# Patient Record
Sex: Female | Born: 2000 | Race: Black or African American | Hispanic: No | Marital: Single | Smoking: Former smoker
Health system: Southern US, Community
[De-identification: ages and names within clinical notes are randomized; demographics above are authoritative.]

## PROBLEM LIST (undated history)

## (undated) DIAGNOSIS — Z789 Other specified health status: Secondary | ICD-10-CM

## (undated) DIAGNOSIS — D219 Benign neoplasm of connective and other soft tissue, unspecified: Secondary | ICD-10-CM

## (undated) HISTORY — DX: Benign neoplasm of connective and other soft tissue, unspecified: D21.9

## (undated) HISTORY — DX: Other specified health status: Z78.9

---
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
## 2019-04-06 ENCOUNTER — Emergency Department
Admission: EM | Admit: 2019-04-06 | Discharge: 2019-04-06 | Disposition: A | Discharge status: Home / Self Care | Payer: Medicaid Other | Attending: Emergency Medicine | Admitting: Emergency Medicine

## 2019-04-06 ENCOUNTER — Other Ambulatory Visit: Payer: Self-pay

## 2019-04-06 ENCOUNTER — Encounter: Payer: Self-pay | Admitting: Emergency Medicine

## 2019-04-06 DIAGNOSIS — N390 Urinary tract infection, site not specified: Secondary | ICD-10-CM

## 2019-04-06 DIAGNOSIS — R1030 Lower abdominal pain, unspecified: Secondary | ICD-10-CM | POA: Diagnosis present

## 2019-04-06 DIAGNOSIS — N3090 Cystitis, unspecified without hematuria: Secondary | ICD-10-CM | POA: Insufficient documentation

## 2019-04-06 LAB — URINALYSIS, COMPLETE (UACMP) WITH MICROSCOPIC
Bilirubin Urine: NEGATIVE
Glucose, UA: NEGATIVE mg/dL
Hgb urine dipstick: NEGATIVE
Ketones, ur: NEGATIVE mg/dL
Nitrite: NEGATIVE
Protein, ur: NEGATIVE mg/dL
Specific Gravity, Urine: 1.026 (ref 1.005–1.030)
pH: 5 (ref 5.0–8.0)

## 2019-04-06 LAB — COMPREHENSIVE METABOLIC PANEL
ALT: 12 U/L (ref 0–44)
AST: 18 U/L (ref 15–41)
Albumin: 3.8 g/dL (ref 3.5–5.0)
Alkaline Phosphatase: 45 U/L (ref 38–126)
Anion gap: 6 (ref 5–15)
BUN: 10 mg/dL (ref 6–20)
CO2: 29 mmol/L (ref 22–32)
Calcium: 9.2 mg/dL (ref 8.9–10.3)
Chloride: 104 mmol/L (ref 98–111)
Creatinine, Ser: 0.84 mg/dL (ref 0.44–1.00)
GFR calc Af Amer: >60 mL/min (ref >60)
GFR calc non Af Amer: >60 mL/min (ref >60)
Glucose, Bld: 69 mg/dL — ABNORMAL LOW (ref 70–99)
Potassium: 3.5 mmol/L (ref 3.5–5.1)
Sodium: 139 mmol/L (ref 135–145)
Total Bilirubin: 0.3 mg/dL (ref 0.3–1.2)
Total Protein: 7.1 g/dL (ref 6.5–8.1)

## 2019-04-06 LAB — LIPASE, BLOOD: Lipase: 20 U/L (ref 11–51)

## 2019-04-06 LAB — CHLAMYDIA/NGC RT PCR (ARMC ONLY)
Chlamydia Tr: DETECTED — AB
N gonorrhoeae: DETECTED — AB

## 2019-04-06 LAB — CBC
HCT: 36.4 % (ref 36.0–46.0)
Hemoglobin: 11.6 g/dL — ABNORMAL LOW (ref 12.0–15.0)
MCH: 27.8 pg (ref 26.0–34.0)
MCHC: 31.9 g/dL (ref 30.0–36.0)
MCV: 87.3 fL (ref 80.0–100.0)
Platelets: 242 10*3/uL (ref 150–400)
RBC: 4.17 MIL/uL (ref 3.87–5.11)
RDW: 15.9 % — ABNORMAL HIGH (ref 11.5–15.5)
WBC: 8.1 10*3/uL (ref 4.0–10.5)
nRBC: 0 % (ref 0.0–0.2)

## 2019-04-06 LAB — CHLAMYDIA/NGC RT PCR (ARMC ONLY)??????????

## 2019-04-06 LAB — POCT PREGNANCY, URINE: Preg Test, Ur: NEGATIVE

## 2019-04-06 MED ORDER — SULFAMETHOXAZOLE-TRIMETHOPRIM 800-160 MG PO TABS: 1.0000 | ORAL_TABLET | Freq: Two times a day (BID) | ORAL | 0 refills | Status: DC

## 2019-04-06 MED ORDER — PHENAZOPYRIDINE HCL 200 MG PO TABS: 200.0000 mg | ORAL_TABLET | Freq: Three times a day (TID) | ORAL | 0 refills | Status: DC | PRN

## 2019-04-06 MED ORDER — SODIUM CHLORIDE 0.9% FLUSH: 3.0000 mL | Freq: Once | INTRAVENOUS | Status: DC

## 2019-04-06 NOTE — ED Provider Notes (Signed)
[facility] Emergency Department Provider Note       Time seen: ----------------------------------------- 4:27 PM on 04/06/2019 -----------------------------------------   I have reviewed the triage vital signs and the nursing notes.  HISTORY   Chief Complaint Abdominal Pain    HPI Pamela Brock is a 18 y.o. female with no significant past medical history who presents to the ED for lower abdominal pain for the past 5 to 6 days.  She has also had pain in her upper abdomen as well.  Last bowel movement was yesterday, she has not had any vomiting, just nausea.  She was concerned she may be pregnant.  She has had unprotected sex.  Denies vaginal bleeding or discharge.  No concerns for sexually transmitted infection  History reviewed. No pertinent past medical history.  There are no active problems to display for this patient.   History reviewed. No pertinent surgical history.  Allergies Patient has no known allergies.  Social History Social History   Tobacco Use  . Smoking status: Never Smoker  . Smokeless tobacco: Never Used  Substance Use Topics  . Alcohol use: No  . Drug use: No   Review of Systems Constitutional: Negative for fever. Cardiovascular: Negative for chest pain. Respiratory: Negative for shortness of breath. Gastrointestinal: Positive for abdominal pain, nausea Musculoskeletal: Negative for back pain. Skin: Negative for rash. Neurological: Negative for headaches, focal weakness or numbness.  All systems negative/normal/unremarkable except as stated in the HPI  ____________________________________________   PHYSICAL EXAM:  VITAL SIGNS: ED Triage Vitals  Enc Vitals Group     BP 04/06/19 1459 109/67     Pulse Rate 04/06/19 1459 69     Resp 04/06/19 1459 16     Temp 04/06/19 1459 97.9 F (36.6 C)     Temp Source 04/06/19 1459 Oral     SpO2 04/06/19 1459 100 %     Weight 04/06/19 1501 240 lb 1.3 oz (108.9 kg)      Height --      Head Circumference --      Peak Flow --      Pain Score 04/06/19 1501 6     Pain Loc --      Pain Edu? --      Excl. in [place]? --    Constitutional: Alert and oriented. Well appearing and in no distress. Eyes: Conjunctivae are normal. Normal extraocular movements. Cardiovascular: Normal rate, regular rhythm. No murmurs, rubs, or gallops. Respiratory: Normal respiratory effort without tachypnea nor retractions. Breath sounds are clear and equal bilaterally. No wheezes/rales/rhonchi. Gastrointestinal: Soft and nontender. Normal bowel sounds Musculoskeletal: Nontender with normal range of motion in extremities. No lower extremity tenderness nor edema. Neurologic:  Normal speech and language. No gross focal neurologic deficits are appreciated.  Skin:  Skin is warm, dry and intact. No rash noted. Psychiatric: Mood and affect are normal. Speech and behavior are normal.  ____________________________________________  ED COURSE:  As part of my medical decision making, I reviewed the following data within the [place] History obtained from family if available, nursing notes, old chart and ekg, as well as notes from prior ED visits. Patient presented for abdominal pain, we will assess with labs as indicated at this time.   Procedures  Pamela Brock was evaluated in Emergency Department on 04/06/2019 for the symptoms described in the history of present illness. She was evaluated in the context of the global COVID-19 pandemic, which necessitated consideration that the patient might be at  risk for infection with the SARS-CoV-2 virus that causes COVID-19. Institutional protocols and algorithms that pertain to the evaluation of patients at risk for COVID-19 are in a state of rapid change based on information released by regulatory bodies including the CDC and federal and state organizations. These policies and algorithms were followed during the patient's care in the ED.   ____________________________________________   LABS (pertinent positives/negatives)  Labs Reviewed  COMPREHENSIVE METABOLIC PANEL - Abnormal; Notable for the following components:      Result Value   Glucose, Bld 69 (*)    All other components within normal limits  CBC - Abnormal; Notable for the following components:   Hemoglobin 11.6 (*)    RDW 15.9 (*)    All other components within normal limits  URINALYSIS, COMPLETE (UACMP) WITH MICROSCOPIC - Abnormal; Notable for the following components:   Color, Urine YELLOW (*)    APPearance CLOUDY (*)    Leukocytes,Ua LARGE (*)    Bacteria, UA RARE (*)    All other components within normal limits  CHLAMYDIA/NGC RT PCR ([facility] ONLY)  LIPASE, BLOOD  POC URINE PREG, ED  POCT PREGNANCY, URINE  ____________________________________________   DIFFERENTIAL DIAGNOSIS   Pregnancy, UTI, PID, ovarian cyst, constipation, gas pain  FINAL ASSESSMENT AND PLAN  Cystitis   Plan: The patient had presented for lower abdominal pain. Patient's labs revealed likely UTI without other acute process.  GC and Chlamydia testing are still pending.  She will be started on Pyridium and Bactrim DS for UTI.  She is cleared for outpatient follow-up.  Laurence Aly, MD    Note: This note was generated in part or whole with voice recognition software. Voice recognition is usually quite accurate but there are transcription errors that can and very often do occur. I apologize for any typographical errors that were not detected and corrected.     Earleen Newport, MD 04/06/19 [phone]

## 2019-04-06 NOTE — ED Triage Notes (Signed)
C/O generalized abdominal pain x 5-6 days.  Describes lower abdominal pain primarily, but also c/o generalized abdominal pain.  Last BM 04/05/19.  No emesis, just nausea.  AAOx3.  Skin warm and dry. NAD

## 2019-04-06 NOTE — ED Notes (Signed)
Pt ambulatory from triage to the room at this time. NAD noted.

## 2019-04-07 ENCOUNTER — Other Ambulatory Visit: Payer: Self-pay

## 2019-04-07 ENCOUNTER — Ambulatory Visit: Payer: Medicaid Other | Admitting: Family Medicine

## 2019-04-07 ENCOUNTER — Encounter: Payer: Self-pay | Admitting: Family Medicine

## 2019-04-07 ENCOUNTER — Telehealth: Payer: Self-pay | Admitting: Emergency Medicine

## 2019-04-07 DIAGNOSIS — B9689 Other specified bacterial agents as the cause of diseases classified elsewhere: Secondary | ICD-10-CM

## 2019-04-07 DIAGNOSIS — A549 Gonococcal infection, unspecified: Secondary | ICD-10-CM

## 2019-04-07 DIAGNOSIS — A749 Chlamydial infection, unspecified: Secondary | ICD-10-CM

## 2019-04-07 DIAGNOSIS — F1210 Cannabis abuse, uncomplicated: Secondary | ICD-10-CM | POA: Insufficient documentation

## 2019-04-07 DIAGNOSIS — A64 Unspecified sexually transmitted disease: Secondary | ICD-10-CM

## 2019-04-07 DIAGNOSIS — Z113 Encounter for screening for infections with a predominantly sexual mode of transmission: Secondary | ICD-10-CM

## 2019-04-07 DIAGNOSIS — N730 Acute parametritis and pelvic cellulitis: Secondary | ICD-10-CM

## 2019-04-07 DIAGNOSIS — N760 Acute vaginitis: Secondary | ICD-10-CM

## 2019-04-07 LAB — WET PREP FOR TRICH, YEAST, CLUE
Trichomonas Exam: NEGATIVE
Yeast Exam: NEGATIVE

## 2019-04-07 MED ORDER — DOXYCYCLINE HYCLATE 100 MG PO CAPS: 100.0000 mg | ORAL_CAPSULE | Freq: Two times a day (BID) | ORAL | 0 refills | Status: DC

## 2019-04-07 MED ORDER — METRONIDAZOLE 500 MG PO TABS: 500.0000 mg | ORAL_TABLET | Freq: Two times a day (BID) | ORAL | 0 refills | Status: AC

## 2019-04-07 MED ORDER — CEFTRIAXONE SODIUM 250 MG IJ SOLR
250.0000 mg | Freq: Once | INTRAMUSCULAR | Status: AC
  Administered 2019-04-07: 250 mg via INTRAMUSCULAR

## 2019-04-07 MED ORDER — DOXYCYCLINE HYCLATE 100 MG PO CAPS: 100.0000 mg | ORAL_CAPSULE | Freq: Two times a day (BID) | ORAL | 0 refills | Status: AC

## 2019-04-07 NOTE — Progress Notes (Signed)
STI clinic/screening visit  Subjective:  Pamela Brock is a 18 y.o. female being seen today for an STI screening visit. The patient reports they do have symptoms.  Patient has the following medical conditions:   Patient Active Problem List   Diagnosis Date Noted  . Marijuana abuse 04/07/2019   Chief Complaint  Patient presents with  . SEXUALLY TRANSMITTED DISEASE    HPI  Patient reports Abdominal pain, cramping, nausea for 1 week.   See flowsheet for further details and programmatic requirements.    The following portions of the patient's history were reviewed and updated as appropriate: allergies, current medications, past medical history, past social history, past surgical history and problem list.  Objective:  There were no vitals filed for this visit.  Physical Exam Vitals signs and nursing note reviewed.  Constitutional:      Appearance: Normal appearance.  HENT:     Head: Normocephalic and atraumatic.     Mouth/Throat:     Mouth: Mucous membranes are moist.     Pharynx: Oropharynx is clear. No oropharyngeal exudate or posterior oropharyngeal erythema.  Pulmonary:     Effort: Pulmonary effort is normal.  Abdominal:     General: Abdomen is flat.     Palpations: There is no mass.     Tenderness: There is no abdominal tenderness. There is no rebound.  Genitourinary:    General: Normal vulva.     Exam position: Lithotomy position.     Pubic Area: No rash or pubic lice.      Labia:        Right: No rash or lesion.        Left: No rash or lesion.      Vagina: Normal. No vaginal discharge, erythema, bleeding or lesions.     Cervix: Cervical motion tenderness and discharge (creamy, copious from os) present. No friability, lesion or erythema.     Uterus: Normal. Tender.      Adnexa:        Right: Tenderness present.        Left: Tenderness present.      Rectum: Normal.  Lymphadenopathy:     Head:     Right side of head: No preauricular or posterior auricular  adenopathy.     Left side of head: No preauricular or posterior auricular adenopathy.     Cervical: No cervical adenopathy.     Upper Body:     Right upper body: No supraclavicular or axillary adenopathy.     Left upper body: No supraclavicular or axillary adenopathy.     Lower Body: No right inguinal adenopathy. No left inguinal adenopathy.  Skin:    General: Skin is warm and dry.     Findings: No rash.  Neurological:     Mental Status: She is alert and oriented to person, place, and time.       Assessment and Plan:  Pamela Brock is a 18 y.o. female presenting to the [facility] Department for STI screening  1. Screening examination for venereal disease Patient accepted all screenings including vaginal CT/GC and bloodwork for HIV/RPR. Patient meets criteria for HepB and HepC Screening and accepts screening for these today. Treat wet prep per standing order Counseled on warning s/sx and when to seek care Recommended condom use with all sex Patient is currently using condoms "sometimes" to prevent pregnancy.   - GC/CT not collected due to recent positive - WET PREP FOR [place], YEAST, CLUE - Syphilis Serology, [place]  Lab - HIV/HCV [place] [facility] - HBV Antigen/Antibody [facility]  2. PID with recently positive Gonorrhea and Chlamydia Will treat for PID given abdominal pain, cramping and nausea. TTP on exam.  No need to repeat screening given positive results 9/20. UTI at ER likely related to GC/CT-- as UA only positive for LE. Instructed patient to not fill bactrim.  Reviewed no sex until > 2 weeks for medication and partner is treated as well.  - Syphilis Serology, [place] [facility] - HIV/HCV [place] [facility] - HBV Antigen/Antibody State Lab  3. Chlamydia infection - Syphilis Serology, [place] [facility] - HIV/HCV [place] [facility] - HBV Antigen/Antibody State Lab  4. Sexually transmitted disease in female - Syphilis Serology, [place] Lab - HIV/HCV [place] [facility] - HBV  Antigen/Antibody State Lab  Return in about 4 days (around 04/11/2019) for PID recheck. FP method discussion.  Future Appointments  Date Time Provider [place]  04/14/2019  9:20 AM [facility]-STI PROVIDER [facility] None    Caren Macadam, MD

## 2019-04-07 NOTE — Patient Instructions (Signed)
Pelvic Inflammatory Disease  Pelvic inflammatory disease (PID) is an infection in some or all of the female reproductive organs. PID can be in the womb (uterus), ovaries, fallopian tubes, or nearby tissues that are inside the lower belly area (pelvis). PID can lead to problems if it is not treated. What are the causes?  Germs (bacteria) that are spread during sex. This is the most common cause.  Germs in the vagina that are not spread during sex.  Germs that travel up from the vagina or cervix to the reproductive organs after: ? The birth of a baby. ? A miscarriage. ? An abortion. ? Pelvic surgery. ? Insertion of an intrauterine device (IUD). ? A sexual assault. What increases the risk?  Being younger than 19 years old.  Having sex at a young age.  Having a history of STI (sexually transmitted infection) or PID.  Not using barrier birth control, such as condoms.  Having a lot of sex partners.  Having sex with someone who has symptoms of an STI.  Using a douche.  Having an IUD put in place. What are the signs or symptoms?  Pain in the belly area.  Fever.  Chills.  Discharge from the vagina that is not normal.  Bleeding from the womb that is not normal.  Pain soon after the end of a menstrual period.  Pain when you pee (urinate).  Pain with sex.  Feeling sick to your stomach (nauseous) or throwing up (vomiting). How is this treated?  Antibiotic medicines. In very bad cases, these may be given through an IV tube.  Surgery. This is rare.  Efforts to stop the spread of the infection. Sex partners may need to be treated. It may take weeks until you feel all better. Your doctor may test you for infection again after you finish treatment. You should also be checked for HIV (human immunodeficiency virus). Follow these instructions at home:  Take over-the-counter and prescription medicines only as told by your doctor.  If you were prescribed an antibiotic  medicine, take it as told by your doctor. Do not stop taking it even if you start to feel better.  Do not have sex until treatment is done or as told by your doctor.  Tell your sex partner if you have PID. Your partner may need to be treated.  Keep all follow-up visits as told by your doctor. This is important. Contact a doctor if:  You have more fluid or fluid that is not normal coming from your vagina.  Your pain does not improve.  You throw up.  You have a fever.  You cannot take your medicines.  Your partner has an STI.  You have pain when you pee. Get help right away if:  You have more pain in the belly area.  You have chills.  You are not better in 72 hours with treatment. Summary  Pelvic inflammatory disease (PID) is caused by an infection in some or all of the female reproductive organs.  PID is a serious infection.  This infection is most often treated with antibiotics.  Do not have sex until treatment is done or as told by your doctor. This information is not intended to replace advice given to you by your health care provider. Make sure you discuss any questions you have with your health care provider. Document Released: 09/29/2008 Document Revised: 03/21/2018 Document Reviewed: 03/27/2018 Elsevier Patient Education  2020 Elsevier Inc.  

## 2019-04-07 NOTE — Telephone Encounter (Signed)
Called patient to inform patient that her gonorrhea and chlamydia tests were positive and she needs treatment.  Explained that she can go to pcp or [facility] for treatment.  I gave her health dept number and explained need for partner treatment as well.

## 2019-04-07 NOTE — Progress Notes (Signed)
Patient states she is here for GC/Chlamydia treatment per 04/07/2019 call from [facility]. Wants STD testing "while she is here." Accepts bloodwork. Hal Morales, RN Patient treated for PID per provider orders. Wet Prep results reviewed by provider Dr. Ernestina Patches. Patient treated for BV per verbal order and standing orders. Hal Morales, RN

## 2019-04-14 ENCOUNTER — Other Ambulatory Visit: Payer: Self-pay

## 2019-04-14 ENCOUNTER — Ambulatory Visit: Payer: Medicaid Other | Admitting: Physician Assistant

## 2019-04-14 ENCOUNTER — Encounter: Payer: Self-pay | Admitting: Physician Assistant

## 2019-04-14 DIAGNOSIS — N739 Female pelvic inflammatory disease, unspecified: Secondary | ICD-10-CM | POA: Diagnosis not present

## 2019-04-14 DIAGNOSIS — Z09 Encounter for follow-up examination after completed treatment for conditions other than malignant neoplasm: Secondary | ICD-10-CM | POA: Diagnosis not present

## 2019-04-14 DIAGNOSIS — Z113 Encounter for screening for infections with a predominantly sexual mode of transmission: Secondary | ICD-10-CM

## 2019-04-14 NOTE — Progress Notes (Signed)
   [place] problem visit  [place] Department  Subjective:  Pamela Brock is a 18 y.o. being seen today for repeat bimanual exam for PID recheck.  Chief Complaint  Patient presents with  . SEXUALLY TRANSMITTED DISEASE    PID recheck    HPI  Patient states that she is feeling much better and is not having trouble keeping the medicine down.  Reports that she has had some itching and that she started using an OTC antifungal cream that is helping with the itching.    Does the patient have a current or past history of drug use? No   No components found for: HCV]   Health Maintenance Due  Topic Date Due  . HIV Screening  09/15/2014  . TETANUS/TDAP  09/15/2018  . INFLUENZA VACCINE  02/15/2019    Review of Systems  All other systems reviewed and are negative.   The following portions of the patient's history were reviewed and updated as appropriate: allergies, current medications, past family history, past medical history, past social history, past surgical history and problem list. Problem list updated.   See flowsheet for other program required questions.  Objective:  There were no vitals filed for this visit.  Physical Exam Constitutional:      General: She is not in acute distress.    Appearance: Normal appearance.  HENT:     Head: Normocephalic and atraumatic.  Pulmonary:     Effort: Pulmonary effort is normal.  Abdominal:     Palpations: Abdomen is soft. There is no mass.     Tenderness: There is no abdominal tenderness. There is no guarding or rebound.  Genitourinary:    General: Normal vulva.     Rectum: Normal.     Comments: External genitalia/pubic area without nits, lice, edema, erythema, lesions or inguinal adenopathy. Bimanual with uterus firm, mobile, nt, no masses, no CMT, no adnexal tenderness or fullness. Neurological:     Mental Status: She is alert and oriented to person, place, and time.  Psychiatric:      Mood and Affect: Mood normal.        Behavior: Behavior normal.        Thought Content: Thought content normal.        Judgment: Judgment normal.       Assessment and Plan:  Pamela Brock is a 18 y.o. female presenting to the [facility] Department for a Women's Health problem visit  1. Pelvic inflammatory disease Patient counseled to complete all of her antibiotics as directed. No sex until after medicine has been completed and partner has completed treatment. Rec condoms with all sex.  2. Follow-up exam PID recheck with normal exam, PID resolving with treatment. Reviewed that RN will call once results are back if anything is positive. RTC prn.     No follow-ups on file.  No future appointments.  Jerene Dilling, PA

## 2019-04-14 NOTE — Progress Notes (Signed)
Patient returned to clinic for PID follow up. Thinks she may have a yeast infection. Is using OTC cream Hal Morales, RN

## 2019-04-16 LAB — HM HIV SCREENING LAB: HM HIV Screening: NEGATIVE

## 2019-04-16 LAB — HM HEPATITIS C SCREENING LAB: HM Hepatitis Screen: NEGATIVE

## 2019-04-16 LAB — HEPATITIS B SURFACE ANTIGEN

## 2019-05-20 ENCOUNTER — Ambulatory Visit: Payer: Medicaid Other

## 2019-07-02 ENCOUNTER — Ambulatory Visit: Payer: Medicaid Other

## 2019-08-20 ENCOUNTER — Emergency Department
Admission: EM | Admit: 2019-08-20 | Discharge: 2019-08-20 | Disposition: A | Discharge status: Home / Self Care | Payer: Medicaid Other | Attending: Student in an Organized Health Care Education/Training Program | Admitting: Student in an Organized Health Care Education/Training Program

## 2019-08-20 ENCOUNTER — Other Ambulatory Visit: Payer: Self-pay

## 2019-08-20 ENCOUNTER — Encounter: Payer: Self-pay | Admitting: Physician Assistant

## 2019-08-20 DIAGNOSIS — R102 Pelvic and perineal pain: Secondary | ICD-10-CM | POA: Insufficient documentation

## 2019-08-20 LAB — CBC
HCT: 36.7 % (ref 36.0–46.0)
Hemoglobin: 11.8 g/dL — ABNORMAL LOW (ref 12.0–15.0)
MCH: 28.4 pg (ref 26.0–34.0)
MCHC: 32.2 g/dL (ref 30.0–36.0)
MCV: 88.2 fL (ref 80.0–100.0)
Platelets: 198 10*3/uL (ref 150–400)
RBC: 4.16 MIL/uL (ref 3.87–5.11)
RDW: 15.9 % — ABNORMAL HIGH (ref 11.5–15.5)
WBC: 7.3 10*3/uL (ref 4.0–10.5)
nRBC: 0 % (ref 0.0–0.2)

## 2019-08-20 LAB — COMPREHENSIVE METABOLIC PANEL
ALT: 11 U/L (ref 0–44)
AST: 16 U/L (ref 15–41)
Albumin: 3.5 g/dL (ref 3.5–5.0)
Alkaline Phosphatase: 34 U/L — ABNORMAL LOW (ref 38–126)
Anion gap: 7 (ref 5–15)
BUN: 10 mg/dL (ref 6–20)
CO2: 28 mmol/L (ref 22–32)
Calcium: 8.8 mg/dL — ABNORMAL LOW (ref 8.9–10.3)
Chloride: 106 mmol/L (ref 98–111)
Creatinine, Ser: 0.72 mg/dL (ref 0.44–1.00)
GFR calc Af Amer: >60 mL/min (ref >60)
GFR calc non Af Amer: >60 mL/min (ref >60)
Glucose, Bld: 94 mg/dL (ref 70–99)
Potassium: 3.8 mmol/L (ref 3.5–5.1)
Sodium: 141 mmol/L (ref 135–145)
Total Bilirubin: 0.4 mg/dL (ref 0.3–1.2)
Total Protein: 6.6 g/dL (ref 6.5–8.1)

## 2019-08-20 LAB — URINALYSIS, COMPLETE (UACMP) WITH MICROSCOPIC
Bilirubin Urine: NEGATIVE
Glucose, UA: NEGATIVE mg/dL
Hgb urine dipstick: NEGATIVE
Ketones, ur: NEGATIVE mg/dL
Nitrite: NEGATIVE
Protein, ur: NEGATIVE mg/dL
Specific Gravity, Urine: 1.031 — ABNORMAL HIGH (ref 1.005–1.030)
pH: 6 (ref 5.0–8.0)

## 2019-08-20 LAB — POCT PREGNANCY, URINE: Preg Test, Ur: NEGATIVE

## 2019-08-20 MED ORDER — ACETAMINOPHEN 500 MG PO TABS
1000.0000 mg | ORAL_TABLET | Freq: Once | ORAL | Status: AC
  Administered 2019-08-20: 1000 mg via ORAL
  Filled 2019-08-20: qty 2

## 2019-08-20 NOTE — Discharge Instructions (Signed)
You have tests pending at this time. Continue to take the previously prescribed antibiotic. Follow-up with [facility] Department for continued symptoms or further testing.

## 2019-08-20 NOTE — ED Triage Notes (Addendum)
Patient to ER for c/o lower abd/pelvic pain since this am with pain radiating into both legs. Patient reports h/o PID with similar feeling pain. Denies any vaginal discharge. Reports feelings of spasm in vagina. Patient states she is on Flagyl currently for BV.

## 2019-08-20 NOTE — ED Provider Notes (Signed)
[facility] Emergency Department Provider Note ____________________________________________  Time seen: 2206  I have reviewed the triage vital signs and the nursing notes.  HISTORY  Chief Complaint  Pelvic Pain and Abdominal Pain  HPI Pamela Brock is a 19 y.o. female presents herself to the ED for evaluation of lower abdominal pelvic pain with onset this morning.  Pain she also describes some pain radiating into both legs.  She gives a history of PID, and describes similar symptoms to this particular pain, back in September. She reports she was tested for gonorrhea, chlamydia, and BV last week. She notes her results were made available on Sunday. She also admits to an unprotected sexual encounter, with a new partner on Sunday night.  She denies any current vaginal discharge, dysuria, hematuria, or nausea.  She is currently on Flagyl being treated for BV.  Patient denies any fevers, chills, or sweats.  She is expected to start her period in a few days.   History reviewed. No pertinent past medical history.  Patient Active Problem List   Diagnosis Date Noted  . Marijuana abuse 04/07/2019    History reviewed. No pertinent surgical history.  Prior to Admission medications   Not on File    Allergies Patient has no known allergies.  History reviewed. No pertinent family history.  Social History Social History   Tobacco Use  . Smoking status: Never Smoker  . Smokeless tobacco: Never Used  Substance Use Topics  . Alcohol use: No  . Drug use: Yes    Types: Marijuana    Review of Systems  Constitutional: Negative for fever. Eyes: Negative for visual changes. ENT: Negative for sore throat. Cardiovascular: Negative for chest pain. Respiratory: Negative for shortness of breath. Gastrointestinal: Negative for abdominal pain, vomiting and diarrhea. Genitourinary: Negative for dysuria. Reports pelvic cramping Musculoskeletal: Negative for back  pain. Skin: Negative for rash. Neurological: Negative for headaches, focal weakness or numbness. ____________________________________________  PHYSICAL EXAM:  VITAL SIGNS: ED Triage Vitals  Enc Vitals Group     BP 08/20/19 2051 120/78     Pulse Rate 08/20/19 2051 79     Resp 08/20/19 2051 18     Temp 08/20/19 2051 98.5 F (36.9 C)     Temp Source 08/20/19 2051 Oral     SpO2 08/20/19 2051 100 %     Weight 08/20/19 2053 240 lb 1.3 oz (108.9 kg)     Height --      Head Circumference --      Peak Flow --      Pain Score 08/20/19 2052 8     Pain Loc --      Pain Edu? --      Excl. in [place]? --     Constitutional: Alert and oriented. Well appearing and in no distress. Head: Normocephalic and atraumatic. Eyes: Conjunctivae are normal. Normal extraocular movements Cardiovascular: Normal rate, regular rhythm. Normal distal pulses. Respiratory: Normal respiratory effort. No wheezes/rales/rhonchi. Gastrointestinal: Soft and nontender. No distention, rebound, guarding, or regidity. No CVA tenderness elicited.  GU: deferred. Musculoskeletal: Nontender with normal range of motion in all extremities.  Neurologic:  Normal gait without ataxia. Normal speech and language. No gross focal neurologic deficits are appreciated. Skin:  Skin is warm, dry and intact. No rash noted. ____________________________________________   LABS (pertinent positives/negatives) Labs Reviewed  COMPREHENSIVE METABOLIC PANEL - Abnormal; Notable for the following components:      Result Value   Calcium 8.8 (*)    Alkaline Phosphatase  34 (*)    All other components within normal limits  CBC - Abnormal; Notable for the following components:   Hemoglobin 11.8 (*)    RDW 15.9 (*)    All other components within normal limits  URINALYSIS, COMPLETE (UACMP) WITH MICROSCOPIC - Abnormal; Notable for the following components:   Color, Urine YELLOW (*)    APPearance HAZY (*)    Specific Gravity, Urine 1.031 (*)     Leukocytes,Ua MODERATE (*)    Bacteria, UA RARE (*)    All other components within normal limits  GC/CHLAMYDIA PROBE AMP  HIV ANTIBODY (ROUTINE TESTING W REFLEX)  POC URINE PREG, ED  POCT PREGNANCY, URINE  ____________________________________________  PROCEDURES  Tylenol 1000 mg PO Procedures ____________________________________________  INITIAL IMPRESSION / ASSESSMENT AND PLAN / ED COURSE  Patient with ED evaluation of mild intermittent pelvic pain and cramping. She is currently on Flagyl for BV. Her exam and labs are otherwise normal and reassuring. She may be experiencing premenstrual symptoms. She is requesting testing for GC. I have advised that a urine sample can be sent, but is not the optimal test source for vaginitis. She will await results and follow-up with [facility] if symptoms persists.   Pamela Brock was evaluated in Emergency Department on 08/20/2019 for the symptoms described in the history of present illness. She was evaluated in the context of the global COVID-19 pandemic, which necessitated consideration that the patient might be at risk for infection with the SARS-CoV-2 virus that causes COVID-19. Institutional protocols and algorithms that pertain to the evaluation of patients at risk for COVID-19 are in a state of rapid change based on information released by regulatory bodies including the CDC and federal and state organizations. These policies and algorithms were followed during the patient's care in the ED. ____________________________________________  FINAL CLINICAL IMPRESSION(S) / ED DIAGNOSES  Final diagnoses:  Pelvic pain in female      Melvenia Needles, PA-C 08/20/19 2354    Merlyn Lot, MD 08/21/19 1504

## 2019-08-21 LAB — HIV ANTIBODY (ROUTINE TESTING W REFLEX): HIV Screen 4th Generation wRfx: NONREACTIVE

## 2019-08-23 ENCOUNTER — Encounter: Payer: Self-pay | Admitting: Emergency Medicine

## 2019-08-23 ENCOUNTER — Other Ambulatory Visit: Payer: Self-pay

## 2019-08-23 ENCOUNTER — Emergency Department
Admission: EM | Admit: 2019-08-23 | Discharge: 2019-08-23 | Disposition: A | Discharge status: Home / Self Care | Payer: Medicaid Other | Attending: Emergency Medicine | Admitting: Emergency Medicine

## 2019-08-23 ENCOUNTER — Emergency Department: Payer: Medicaid Other

## 2019-08-23 DIAGNOSIS — B379 Candidiasis, unspecified: Secondary | ICD-10-CM

## 2019-08-23 DIAGNOSIS — D219 Benign neoplasm of connective and other soft tissue, unspecified: Secondary | ICD-10-CM

## 2019-08-23 DIAGNOSIS — R102 Pelvic and perineal pain: Secondary | ICD-10-CM | POA: Diagnosis not present

## 2019-08-23 DIAGNOSIS — R1033 Periumbilical pain: Secondary | ICD-10-CM | POA: Diagnosis not present

## 2019-08-23 DIAGNOSIS — B373 Candidiasis of vulva and vagina: Secondary | ICD-10-CM | POA: Insufficient documentation

## 2019-08-23 DIAGNOSIS — D259 Leiomyoma of uterus, unspecified: Secondary | ICD-10-CM | POA: Diagnosis not present

## 2019-08-23 LAB — URINALYSIS, ROUTINE W REFLEX MICROSCOPIC
Bacteria, UA: NONE SEEN
Bilirubin Urine: NEGATIVE
Glucose, UA: NEGATIVE mg/dL
Hgb urine dipstick: NEGATIVE
Ketones, ur: NEGATIVE mg/dL
Nitrite: NEGATIVE
Protein, ur: NEGATIVE mg/dL
Specific Gravity, Urine: 1.003 — ABNORMAL LOW (ref 1.005–1.030)
pH: 7 (ref 5.0–8.0)

## 2019-08-23 LAB — CBC WITH DIFFERENTIAL/PLATELET
Abs Immature Granulocytes: 0.03 10*3/uL (ref 0.00–0.07)
Basophils Absolute: 0 10*3/uL (ref 0.0–0.1)
Basophils Relative: 0 %
Eosinophils Absolute: 0 10*3/uL (ref 0.0–0.5)
Eosinophils Relative: 1 %
HCT: 40 % (ref 36.0–46.0)
Hemoglobin: 12.9 g/dL (ref 12.0–15.0)
Immature Granulocytes: 0 %
Lymphocytes Relative: 21 %
Lymphs Abs: 1.9 10*3/uL (ref 0.7–4.0)
MCH: 28 pg (ref 26.0–34.0)
MCHC: 32.3 g/dL (ref 30.0–36.0)
MCV: 87 fL (ref 80.0–100.0)
Monocytes Absolute: 0.6 10*3/uL (ref 0.1–1.0)
Monocytes Relative: 7 %
Neutro Abs: 6.3 10*3/uL (ref 1.7–7.7)
Neutrophils Relative %: 71 %
Platelets: 233 10*3/uL (ref 150–400)
RBC: 4.6 MIL/uL (ref 3.87–5.11)
RDW: 15.6 % — ABNORMAL HIGH (ref 11.5–15.5)
WBC: 8.8 10*3/uL (ref 4.0–10.5)
nRBC: 0 % (ref 0.0–0.2)

## 2019-08-23 LAB — WET PREP, GENITAL
Clue Cells Wet Prep HPF POC: NONE SEEN
Sperm: NONE SEEN
Trich, Wet Prep: NONE SEEN

## 2019-08-23 LAB — BASIC METABOLIC PANEL
Anion gap: 8 (ref 5–15)
BUN: 8 mg/dL (ref 6–20)
CO2: 27 mmol/L (ref 22–32)
Calcium: 9.1 mg/dL (ref 8.9–10.3)
Chloride: 103 mmol/L (ref 98–111)
Creatinine, Ser: 0.76 mg/dL (ref 0.44–1.00)
GFR calc Af Amer: >60 mL/min (ref >60)
GFR calc non Af Amer: >60 mL/min (ref >60)
Glucose, Bld: 94 mg/dL (ref 70–99)
Potassium: 4.5 mmol/L (ref 3.5–5.1)
Sodium: 138 mmol/L (ref 135–145)

## 2019-08-23 LAB — GC/CHLAMYDIA PROBE AMP
Chlamydia trachomatis, NAA: NEGATIVE
Neisseria Gonorrhoeae by PCR: NEGATIVE

## 2019-08-23 LAB — POCT PREGNANCY, URINE: Preg Test, Ur: NEGATIVE

## 2019-08-23 MED ORDER — HYDROCODONE-ACETAMINOPHEN 5-325 MG PO TABS
1.0000 | ORAL_TABLET | Freq: Once | ORAL | Status: AC
  Administered 2019-08-23: 10:00:00 1 via ORAL
  Filled 2019-08-23: qty 1

## 2019-08-23 MED ORDER — TRAMADOL HCL 50 MG PO TABS: 50.0000 mg | ORAL_TABLET | Freq: Four times a day (QID) | ORAL | 0 refills | Status: AC | PRN

## 2019-08-23 MED ORDER — IBUPROFEN 600 MG PO TABS: 600.0000 mg | ORAL_TABLET | Freq: Four times a day (QID) | ORAL | 0 refills | Status: DC | PRN

## 2019-08-23 NOTE — ED Triage Notes (Signed)
Pt arrives POV to triage with c/o pelvic pain and lower abdominal pain x 4 days. Pt was seen her for the same on the third and pt has not experienced relief at this time. Pt and mother request pelvic exam at this time.

## 2019-08-23 NOTE — ED Provider Notes (Signed)
[facility] Emergency Department Provider Note ____________________________________________   First MD Initiated Contact with Patient 08/23/19 [phone]     (approximate)  I have reviewed the triage vital signs and the nursing notes.   HISTORY  Chief Complaint Pelvic Pain and Abdominal Pain    HPI Pamela Brock is a 19 y.o. female with PMH as noted below who presents with abdominal and pelvic area pain over about the last 4 to 5 days, somewhat crampy and initially more around the middle of her abdomen but now more localized in the pelvic area.  She reports some nausea but no vomiting.  She denies any significant discharge and has not had vaginal bleeding.  The patient is being treated for BV and still has few days of her antibiotic.  She states she was tested for STDs about 2 weeks ago and had a single unprotected sexual encounter since then.  She has had sexual intercourse with several partners, but states that other than that one episode she uses condoms.  She was seen in the ED 3 days ago with this pain.  She has had PID once in the past and states that it felt somewhat similar although she had more back pain at that time.  History reviewed. No pertinent past medical history.  Patient Active Problem List   Diagnosis Date Noted  . Marijuana abuse 04/07/2019    History reviewed. No pertinent surgical history.  Prior to Admission medications   Medication Sig Start Date End Date Taking? Authorizing Provider  ibuprofen (ADVIL) 600 MG tablet Take 1 tablet (600 mg total) by mouth every 6 (six) hours as needed. 08/23/19   Arta Silence, MD  traMADol (ULTRAM) 50 MG tablet Take 1 tablet (50 mg total) by mouth every 6 (six) hours as needed for up to 5 days for severe pain. 08/23/19 08/28/19  Arta Silence, MD    Allergies Patient has no known allergies.  No family history on file.  Social History Social History   Tobacco Use  . Smoking status: Never  Smoker  . Smokeless tobacco: Never Used  Substance Use Topics  . Alcohol use: No  . Drug use: Yes    Types: Marijuana    Review of Systems  Constitutional: No fever. Eyes: No redness. ENT: No sore throat. Cardiovascular: Denies chest pain. Respiratory: Denies shortness of breath. Gastrointestinal: No vomiting or diarrhea.  Genitourinary: Negative for dysuria.  No vaginal bleeding or discharge. Musculoskeletal: Negative for back pain. Skin: Negative for rash. Neurological: Negative for headache.   ____________________________________________   PHYSICAL EXAM:  VITAL SIGNS: ED Triage Vitals  Enc Vitals Group     BP 08/23/19 0700 117/85     Pulse Rate 08/23/19 0700 82     Resp 08/23/19 0700 20     Temp 08/23/19 0700 98.3 F (36.8 C)     Temp Source 08/23/19 0700 Oral     SpO2 08/23/19 0700 100 %     Weight 08/23/19 0658 175 lb (79.4 kg)     Height 08/23/19 0658 5\' 9"  (1.753 m)     Head Circumference --      Peak Flow --      Pain Score 08/23/19 0657 5     Pain Loc --      Pain Edu? --      Excl. in [place]? --     Constitutional: Alert and oriented. Well appearing and in no acute distress. Eyes: Conjunctivae are normal.  Head: Atraumatic. Nose: No  congestion/rhinnorhea. Mouth/Throat: Mucous membranes are moist.   Neck: Normal range of motion.  Cardiovascular: Normal rate, regular rhythm.  Good peripheral circulation. Respiratory: Normal respiratory effort.  No retractions. Gastrointestinal: Soft and nontender. No distention.  Genitourinary: Normal external genitalia.  No CMT or adnexal tenderness.  Clumpy appearing whitish discharge. Musculoskeletal: No lower extremity edema.  Extremities warm and well perfused.  Neurologic:  Normal speech and language. No gross focal neurologic deficits are appreciated.  Skin:  Skin is warm and dry. No rash noted. Psychiatric: Mood and affect are normal. Speech and behavior are  normal.  ____________________________________________   LABS (all labs ordered are listed, but only abnormal results are displayed)  Labs Reviewed  WET PREP, GENITAL - Abnormal; Notable for the following components:      Result Value   Yeast Wet Prep HPF POC PRESENT (*)    WBC, Wet Prep HPF POC MANY (*)    All other components within normal limits  URINALYSIS, ROUTINE W REFLEX MICROSCOPIC - Abnormal; Notable for the following components:   Color, Urine STRAW (*)    APPearance CLEAR (*)    Specific Gravity, Urine 1.003 (*)    Leukocytes,Ua LARGE (*)    All other components within normal limits  CBC WITH DIFFERENTIAL/PLATELET - Abnormal; Notable for the following components:   RDW 15.6 (*)    All other components within normal limits  BASIC METABOLIC PANEL  POC URINE PREG, ED  POCT PREGNANCY, URINE   ____________________________________________  EKG   ____________________________________________  RADIOLOGY  US pelvis: 2 fibroids with no other acute abnormalities ____________________________________________   PROCEDURES  Procedure(s) performed: No  Procedures  Critical Care performed: No ____________________________________________   INITIAL IMPRESSION / ASSESSMENT AND PLAN / ED COURSE  Pertinent labs & imaging results that were available during my care of the patient were reviewed by me and considered in my medical decision making (see chart for details).  19 year old female with PMH as noted above presents with abdominal and pelvic pain over proximal last 4 to 5 days associated with some nausea but no fever, urinary symptoms, vaginal bleeding or discharge.  I reviewed the past medical records in [place].  The patient was seen in the ED on 2/3.  A urine GC/CT test was sent but there is no result yet available.  She was diagnosed with likely premenstrual symptoms.  On exam today, the patient is overall well-appearing and her vital signs are normal.  She has no  significant abdominal tenderness.  Pelvic exam shows white discharge most consistent with yeast but no CMT or adnexal tenderness.  Based on my evaluation, there is no clinical evidence for PID or cervicitis.  Given the patient's overall well appearance, lack of significant acute pain, and reassuring exam, there is no evidence for torsion.  Differential includes continued pain related to BV, yeast infection, premenstrual cramps, ovarian cyst, fibroid, or other benign etiology.  We have obtained a wet prep and will get a pelvic ultrasound.  Lab work-up is pending.  ----------------------------------------- 10:16 AM on 08/23/2019 -----------------------------------------  Ultrasound shows 2 fibroids, one of them around 5 cm.  The wet prep shows yeast and some WBCs.  Urinalysis shows some WBCs as well, however there are no bacteria and the patient has no dysuria, frequency, or other urinary symptoms or an elevated WBC count or other clinical findings of UTI.  Again, there are no clinical findings to suggest PID or cervicitis.  The patient states that she has a Diflucan pill at home that  she was given at the same time as the antibiotic for the BV.  I instructed her to take this when she gets home.  I counseled the patient on the results of the work-up including the fibroids, and recommended that she follow-up with an OB/GYN.  We have provided referral to 2 area practices.  I recommend that the patient take 600 mg of ibuprofen every 6 hours, and I have prescribed tramadol for breakthrough pain.  Return precautions given, and she expresses understanding.  ____________________________________________   FINAL CLINICAL IMPRESSION(S) / ED DIAGNOSES  Final diagnoses:  Pelvic pain  Fibroids  Yeast infection      NEW MEDICATIONS STARTED DURING THIS VISIT:  New Prescriptions   IBUPROFEN (ADVIL) 600 MG TABLET    Take 1 tablet (600 mg total) by mouth every 6 (six) hours as needed.   TRAMADOL  (ULTRAM) 50 MG TABLET    Take 1 tablet (50 mg total) by mouth every 6 (six) hours as needed for up to 5 days for severe pain.     Note:  This document was prepared using Dragon voice recognition software and may include unintentional dictation errors.    Arta Silence, MD 08/23/19 1018

## 2019-08-23 NOTE — ED Notes (Signed)
Patient is getting undressed and being placed in gown.

## 2019-08-23 NOTE — ED Notes (Signed)
Pelvic Cart and Wet Prep set up at patient bedside.

## 2019-08-23 NOTE — Discharge Instructions (Addendum)
You should take the second dose of the medicine that you have for the yeast infection when you get home.  Your exam does not show any signs of PID.  Your ultrasound does show fibroids which are muscular growth on the uterus.  These can often cause significant pain.  You should take the ibuprofen 600 mg every 6 hours as needed for pain, and the tramadol only if needed for more severe pain.  Make an appointment to follow-up with an OB/GYN.  We have given you referral information for 2 of the area OB/GYN practices for you to contact.  You can choose either one based on your insurance and appointment availability.

## 2019-09-11 ENCOUNTER — Other Ambulatory Visit: Payer: Self-pay

## 2019-09-11 DIAGNOSIS — A609 Anogenital herpesviral infection, unspecified: Secondary | ICD-10-CM | POA: Diagnosis not present

## 2019-09-11 DIAGNOSIS — R102 Pelvic and perineal pain: Secondary | ICD-10-CM | POA: Diagnosis present

## 2019-09-11 LAB — URINALYSIS, COMPLETE (UACMP) WITH MICROSCOPIC
Bilirubin Urine: NEGATIVE
Glucose, UA: NEGATIVE mg/dL
Hgb urine dipstick: NEGATIVE
Ketones, ur: NEGATIVE mg/dL
Nitrite: NEGATIVE
Protein, ur: 30 mg/dL — AB
Specific Gravity, Urine: 1.027 (ref 1.005–1.030)
pH: 5 (ref 5.0–8.0)

## 2019-09-11 LAB — POCT PREGNANCY, URINE: Preg Test, Ur: NEGATIVE

## 2019-09-11 NOTE — ED Triage Notes (Addendum)
Pt in with co runny nose and congestion for a week. Also concerned about vaginal pain. Also wants to be checked for herpes, has an area on vagina states "it is like a cut" that burns when she voids. Pt denies any discharge does have hx of chlamydia.

## 2019-09-12 ENCOUNTER — Emergency Department
Admission: EM | Admit: 2019-09-12 | Discharge: 2019-09-12 | Disposition: A | Discharge status: Home / Self Care | Payer: Medicaid Other | Attending: Emergency Medicine | Admitting: Emergency Medicine

## 2019-09-12 DIAGNOSIS — A609 Anogenital herpesviral infection, unspecified: Secondary | ICD-10-CM

## 2019-09-12 LAB — WET PREP, GENITAL
Clue Cells Wet Prep HPF POC: NONE SEEN
Sperm: NONE SEEN
Trich, Wet Prep: NONE SEEN

## 2019-09-12 LAB — HIV ANTIBODY (ROUTINE TESTING W REFLEX): HIV Screen 4th Generation wRfx: NONREACTIVE

## 2019-09-12 LAB — CHLAMYDIA/NGC RT PCR (ARMC ONLY)
Chlamydia Tr: NOT DETECTED
N gonorrhoeae: NOT DETECTED

## 2019-09-12 LAB — RPR: RPR Ser Ql: NONREACTIVE

## 2019-09-12 MED ORDER — OXYCODONE-ACETAMINOPHEN 5-325 MG PO TABS
1.0000 | ORAL_TABLET | Freq: Once | ORAL | Status: AC
  Administered 2019-09-12: 1 via ORAL
  Filled 2019-09-12: qty 1

## 2019-09-12 MED ORDER — VALACYCLOVIR HCL 1 G PO TABS: 2000.0000 mg | ORAL_TABLET | Freq: Two times a day (BID) | ORAL | 0 refills | Status: AC

## 2019-09-12 NOTE — ED Provider Notes (Signed)
[facility] Emergency Department Provider Note  ____________________________________________   First MD Initiated Contact with Patient 09/12/19 [phone]     (approximate)  I have reviewed the triage vital signs and the nursing notes.   HISTORY  Chief Complaint Nasal Congestion and Vaginal Pain    HPI Pamela Brock is a 19 y.o. female with history of chlamydia presents to the emergency department due to vaginal sores which the patient states she has noted for the past 3 days.  Patient states last sexual encounter was 1 week ago with someone and she has only had sex with twice the previous encounter was a month ago.  Patient denies any vaginal discharge..  Patient does admit to burning with urination patient states that she is concerned about the possibility of herpes.  Patient states sexual partner did not complain of any symptoms.        No past medical history on file.  Patient Active Problem List   Diagnosis Date Noted  . Marijuana abuse 04/07/2019    No past surgical history on file.  Prior to Admission medications   Medication Sig Start Date End Date Taking? Authorizing Provider  ibuprofen (ADVIL) 600 MG tablet Take 1 tablet (600 mg total) by mouth every 6 (six) hours as needed. 08/23/19   Arta Silence, MD    Allergies Patient has no known allergies.  No family history on file.  Social History Social History   Tobacco Use  . Smoking status: Never Smoker  . Smokeless tobacco: Never Used  Substance Use Topics  . Alcohol use: No  . Drug use: Yes    Types: Marijuana    Review of Systems Constitutional: No fever/chills Eyes: No visual changes. ENT: No sore throat. Cardiovascular: Denies chest pain. Respiratory: Denies shortness of breath. Gastrointestinal: No abdominal pain.  No nausea, no vomiting.  No diarrhea.  No constipation. Genitourinary: Negative for dysuria.  Positive for vaginal sores Musculoskeletal: Negative for  neck pain.  Negative for back pain. Integumentary: Negative for rash. Neurological: Negative for headaches, focal weakness or numbness.   ____________________________________________   PHYSICAL EXAM:  VITAL SIGNS: ED Triage Vitals [09/11/19 2321]  Enc Vitals Group     BP 117/78     Pulse Rate (!) 128     Resp 20     Temp 98.4 F (36.9 C)     Temp Source Oral     SpO2 98 %     Weight 78 kg (172 lb)     Height 1.753 m (5\' 9" )     Head Circumference      Peak Flow      Pain Score 0     Pain Loc      Pain Edu?      Excl. in [place]?      Constitutional: Alert and oriented.  Eyes: Conjunctivae are normal.  Mouth/Throat: Patient is wearing a mask. Neck: No stridor.  No meningeal signs.   Cardiovascular: Normal rate, regular rhythm. Good peripheral circulation. Grossly normal heart sounds. Respiratory: Normal respiratory effort.  No retractions. Gastrointestinal: Soft and nontender. No distention.  Genitourinary: Multiple shallow-based ulcers noted inferior portion of the vagina and perineal area which are very painful to touch. Musculoskeletal: No lower extremity tenderness nor edema. No gross deformities of extremities. Neurologic:  Normal speech and language. No gross focal neurologic deficits are appreciated.  Skin:  Skin is warm, dry and intact. Psychiatric: Mood and affect are normal. Speech and behavior are normal.  ____________________________________________  LABS (all labs ordered are listed, but only abnormal results are displayed)  Labs Reviewed  WET PREP, GENITAL - Abnormal; Notable for the following components:      Result Value   Yeast Wet Prep HPF POC PRESENT (*)    WBC, Wet Prep HPF POC MANY (*)    All other components within normal limits  URINALYSIS, COMPLETE (UACMP) WITH MICROSCOPIC - Abnormal; Notable for the following components:   Color, Urine YELLOW (*)    APPearance HAZY (*)    Protein, ur 30 (*)    Leukocytes,Ua LARGE (*)    Bacteria, UA RARE  (*)    All other components within normal limits  CHLAMYDIA/NGC RT PCR ([facility] ONLY)  RPR  HIV ANTIBODY (ROUTINE TESTING W REFLEX)  HSV 2 ANTIBODY, IGG  POC URINE PREG, ED  POCT PREGNANCY, URINE   ________________________________________ Procedures   ____________________________________________   INITIAL IMPRESSION / MDM / ASSESSMENT AND PLAN / ED COURSE  As part of my medical decision making, I reviewed the following data within the electronic MEDICAL RECORD NUMBER   20 year old female presented with above-stated history and physical exam concerning for possible genital herpes.  Herpes IgG antibody obtained and pending.  Based on clinical exam valacyclovir will be prescribed. ____________________________________________  FINAL CLINICAL IMPRESSION(S) / ED DIAGNOSES  Final diagnoses:  HSV (herpes simplex virus) anogenital infection     MEDICATIONS GIVEN DURING THIS VISIT:  Medications  oxyCODONE-acetaminophen (PERCOCET/ROXICET) 5-325 MG per tablet 1 tablet (1 tablet Oral Given 09/12/19 0227)     ED Discharge Orders    None      *Please note:  Pamela Brock was evaluated in Emergency Department on 09/12/2019 for the symptoms described in the history of present illness. She was evaluated in the context of the global COVID-19 pandemic, which necessitated consideration that the patient might be at risk for infection with the SARS-CoV-2 virus that causes COVID-19. Institutional protocols and algorithms that pertain to the evaluation of patients at risk for COVID-19 are in a state of rapid change based on information released by regulatory bodies including the CDC and federal and state organizations. These policies and algorithms were followed during the patient's care in the ED.  Some ED evaluations and interventions may be delayed as a result of limited staffing during the pandemic.*  Note:  This document was prepared using Dragon voice recognition software and may include  unintentional dictation errors.   Gregor Hams, MD 09/12/19 [phone]

## 2019-09-13 LAB — HSV 2 ANTIBODY, IGG: HSV 2 Glycoprotein G Ab, IgG: <0.91 index (ref 0.00–0.90)

## 2019-10-20 ENCOUNTER — Ambulatory Visit: Payer: Medicaid Other

## 2019-10-21 ENCOUNTER — Ambulatory Visit: Payer: Medicaid Other

## 2020-03-01 ENCOUNTER — Telehealth: Payer: Self-pay | Admitting: Obstetrics and Gynecology

## 2020-03-01 NOTE — Telephone Encounter (Signed)
Called patient left voicemail to schedule appointment. Referral is attached for reference consider scheduling with Dr.Evans.

## 2020-03-16 ENCOUNTER — Ambulatory Visit (INDEPENDENT_AMBULATORY_CARE_PROVIDER_SITE_OTHER): Payer: Medicaid Other | Admitting: Obstetrics and Gynecology

## 2020-03-16 ENCOUNTER — Encounter: Payer: Self-pay | Admitting: Obstetrics and Gynecology

## 2020-03-16 ENCOUNTER — Other Ambulatory Visit: Payer: Self-pay

## 2020-03-16 VITALS — BP 115/74 | HR 90 | Ht 69.0 in | Wt 171.7 lb

## 2020-03-16 DIAGNOSIS — D251 Intramural leiomyoma of uterus: Secondary | ICD-10-CM | POA: Diagnosis not present

## 2020-03-16 DIAGNOSIS — Z3009 Encounter for other general counseling and advice on contraception: Secondary | ICD-10-CM | POA: Diagnosis not present

## 2020-03-16 DIAGNOSIS — R102 Pelvic and perineal pain: Secondary | ICD-10-CM

## 2020-03-16 NOTE — Progress Notes (Signed)
HPI:      [name] is a 19 y.o. No obstetric history on file. who LMP was Patient's last menstrual period was 02/27/2020.  Subjective:   She presents today as a new patient.  She was seen in the emergency department for pelvic cramping and pain and an ultrasound was performed showing intramural uterine fibroids.  Patient also desires birth control.  She was begun on OCPs but she states that she cannot remember to take them and has already experienced problems with daily use.  She would like to consider other forms of birth control as well as cycle control to help with her pelvic pain and uterine fibroids.    Hx: The following portions of the patient's history were reviewed and updated as appropriate:             She  has no past medical history on file. She does not have any pertinent problems on file. She  has no past surgical history on file. Her family history is not on file. She  reports that she has never smoked. She has never used smokeless tobacco. She reports current drug use. Drug: Marijuana. She reports that she does not drink alcohol. She currently has no medications in their medication list. She has No Known Allergies.       Review of Systems:  Review of Systems  Constitutional: Denied constitutional symptoms, night sweats, recent illness, fatigue, fever, insomnia and weight loss.  Eyes: Denied eye symptoms, eye pain, photophobia, vision change and visual disturbance.  Ears/Nose/Throat/Neck: Denied ear, nose, throat or neck symptoms, hearing loss, nasal discharge, sinus congestion and sore throat.  Cardiovascular: Denied cardiovascular symptoms, arrhythmia, chest pain/pressure, edema, exercise intolerance, orthopnea and palpitations.  Respiratory: Denied pulmonary symptoms, asthma, pleuritic pain, productive sputum, cough, dyspnea and wheezing.  Gastrointestinal: Denied, gastro-esophageal reflux, melena, nausea and vomiting.  Genitourinary: See HPI for additional  information.  Musculoskeletal: Denied musculoskeletal symptoms, stiffness, swelling, muscle weakness and myalgia.  Dermatologic: Denied dermatology symptoms, rash and scar.  Neurologic: Denied neurology symptoms, dizziness, headache, neck pain and syncope.  Psychiatric: Denied psychiatric symptoms, anxiety and depression.  Endocrine: Denied endocrine symptoms including hot flashes and night sweats.   Meds:   No current outpatient medications on file prior to visit.   No current facility-administered medications on file prior to visit.    Objective:     Vitals:   03/16/20 0911  BP: 115/74  Pulse: 90              Ultrasound results reviewed directly with the patient.  Assessment:    No obstetric history on file. Patient Active Problem List   Diagnosis Date Noted  . Marijuana abuse 04/07/2019     1. Fibroids, intramural   2. Pelvic pain in female   3. Birth control counseling     Fibroids likely causing pelvic pain.  Patient desires birth control other than OCPs because she cannot remember to take them.   Plan:            1.  Uterine fibroids discussed in detail.   2.  Birth Control   I discussed multiple birth control options and methods with the patient.  The risks and benefits of each    were reviewed.   IUD   Literature on IUD made available.  Risks and benefits discussed.  She is considering IUD as an option    for birth/cycle control. We also specifically discussed the use of Nexplanon, Depo NuvaRing and OCPs.  She has decided that the best option for her would be IUD.  Orders No orders of the defined types were placed in this encounter.   No orders of the defined types were placed in this encounter.     F/U  Return for She is to call at the start of next menses. I spent 31 minutes involved in the care of this patient preparing to see the patient by obtaining and reviewing her medical history (including labs, imaging tests and prior  procedures), documenting clinical information in the electronic health record (EHR), counseling and coordinating care plans, writing and sending prescriptions, ordering tests or procedures and directly communicating with the patient by discussing pertinent items from her history and physical exam as well as detailing my assessment and plan as noted above so that she has an informed understanding.  All of her questions were answered.  Finis Bud, M.D. 03/16/2020 9:42 AM

## 2020-04-08 ENCOUNTER — Ambulatory Visit: Payer: Medicaid Other | Admitting: Physician Assistant

## 2020-04-08 ENCOUNTER — Encounter: Payer: Self-pay | Admitting: Physician Assistant

## 2020-04-08 ENCOUNTER — Other Ambulatory Visit: Payer: Self-pay

## 2020-04-08 DIAGNOSIS — Z113 Encounter for screening for infections with a predominantly sexual mode of transmission: Secondary | ICD-10-CM | POA: Diagnosis not present

## 2020-04-08 DIAGNOSIS — B379 Candidiasis, unspecified: Secondary | ICD-10-CM | POA: Diagnosis not present

## 2020-04-08 LAB — WET PREP FOR TRICH, YEAST, CLUE: Trichomonas Exam: NEGATIVE

## 2020-04-08 MED ORDER — CLOTRIMAZOLE 1 % VA CREA: 1.0000 | TOPICAL_CREAM | Freq: Every day | VAGINAL | 0 refills | Status: AC

## 2020-04-08 NOTE — Progress Notes (Signed)
[facility] Department STI clinic/screening visit  Subjective:  Pamela Brock is a 19 y.o. female being seen today for an STI screening visit. The patient reports they do have symptoms.  Patient reports that they do not desire a pregnancy in the next year.   They reported they are not interested in discussing contraception today.  No LMP recorded. LMP 03/27/20.   Patient has the following medical conditions:   Patient Active Problem List   Diagnosis Date Noted  . Marijuana abuse 04/07/2019    Chief Complaint  Patient presents with  . Exposure to STD    19 yo woman here for STI screen and eval of 2-day h/o white, thick, itchy vaginal discharge. Had first COVID vaccine and plans to get 2nd dose.  Last HIV test per patient/review of record was 09/12/19 Patient reports last pap was never (age < 62).   See flowsheet for further details and programmatic requirements.    The following portions of the patient's history were reviewed and updated as appropriate: allergies, current medications, past medical history, past social history, past surgical history and problem list.  Objective:  There were no vitals filed for this visit.  Physical Exam Vitals and nursing note reviewed. Exam conducted with a chaperone present.  Constitutional:      Appearance: Normal appearance.  HENT:     Head: Normocephalic and atraumatic.     Mouth/Throat:     Mouth: Mucous membranes are moist.     Pharynx: Oropharynx is clear. No oropharyngeal exudate or posterior oropharyngeal erythema.  Pulmonary:     Effort: Pulmonary effort is normal.  Abdominal:     General: Abdomen is flat.     Palpations: There is no mass.     Tenderness: There is no abdominal tenderness. There is no rebound.  Genitourinary:    General: Normal vulva.     Exam position: Lithotomy position.     Pubic Area: No rash or pubic lice.      Labia:        Right: No rash or lesion.        Left: No rash or lesion.       Vagina: Vaginal discharge present. No erythema, bleeding or lesions.     Cervix: No cervical motion tenderness, discharge, friability, lesion or erythema.     Uterus: Normal.      Adnexa: Right adnexa normal and left adnexa normal.     Rectum: Normal.     Comments: Perineum with few scattered hypopigmented 43mm lesions, no-tender, not raised, no surrounding erythema. Mod amt thick white vaginal discharge, no odor, pH <4.5. Lymphadenopathy:     Head:     Right side of head: No preauricular or posterior auricular adenopathy.     Left side of head: No preauricular or posterior auricular adenopathy.     Cervical: No cervical adenopathy.     Upper Body:     Right upper body: No supraclavicular or axillary adenopathy.     Left upper body: No supraclavicular or axillary adenopathy.     Lower Body: No right inguinal adenopathy. No left inguinal adenopathy.  Skin:    General: Skin is warm and dry.     Findings: No rash.  Neurological:     Mental Status: She is alert and oriented to person, place, and time.    Assessment and Plan:  Pamela Brock is a 19 y.o. female presenting to the [facility] Department for STI screening  1. Routine  screening for STI (sexually transmitted infection) Wet prep pos yeast. Treat wet vaginal yeast per standing orders. HIV, syphilis, vag GC/Chlam NAAT and pharygeal GC cult pending.  Return if symptoms worsen or fail to improve.  No future appointments.  Lora Havens, PA-C

## 2020-04-08 NOTE — Addendum Note (Signed)
Addended by: Jenetta Downer on: 04/08/2020 05:33 PM   Modules accepted: Orders

## 2020-04-08 NOTE — Progress Notes (Addendum)
Here for STD testing. Finished amoxicillin for sinus infection about 3 weeks ago. States she has been seen at [facility] due to uterine fibroids, and was treated for trich and a UTI some time recently.Jenetta Downer, RN

## 2020-04-08 NOTE — Progress Notes (Signed)
Wet mount reviewed, patient treated for yeast per SO.Marland KitchenJenetta Downer, RN

## 2020-04-12 LAB — GONOCOCCUS CULTURE

## 2020-11-23 IMAGING — US US PELVIS COMPLETE WITH TRANSVAGINAL
1 series · 14 of 25 positions shown · non-contrast
Comparison: None

CLINICAL DATA: 18-year-old female with pelvic pain for 4 days.

EXAM:
TRANSABDOMINAL AND TRANSVAGINAL ULTRASOUND OF PELVIS
TECHNIQUE: Both transabdominal and transvaginal ultrasound examinations of the
pelvis were performed. Transabdominal technique was performed for
global imaging of the pelvis including uterus, ovaries, adnexal
regions, and pelvic cul-de-sac. It was necessary to proceed with
endovaginal exam following the transabdominal exam to visualize the
ovaries and endometrium.

[Series 1: us pelvis complete with transvaginal · 83 acquisitions, 14 frames shown]
[im 1/83]
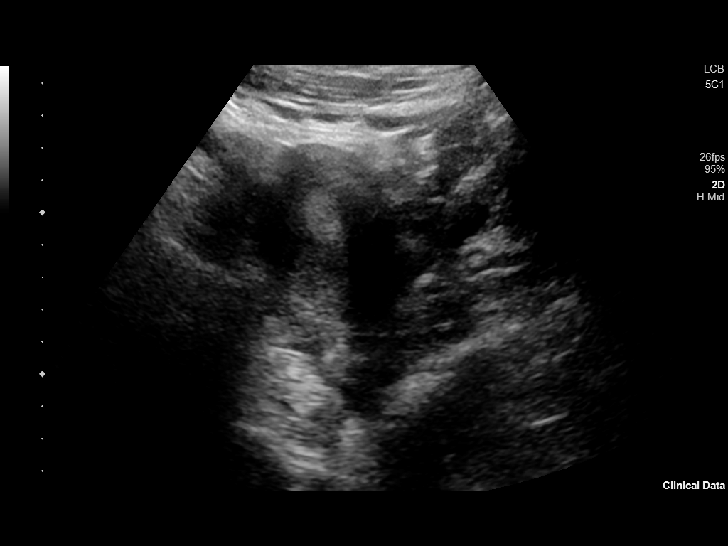
[im 7/83]
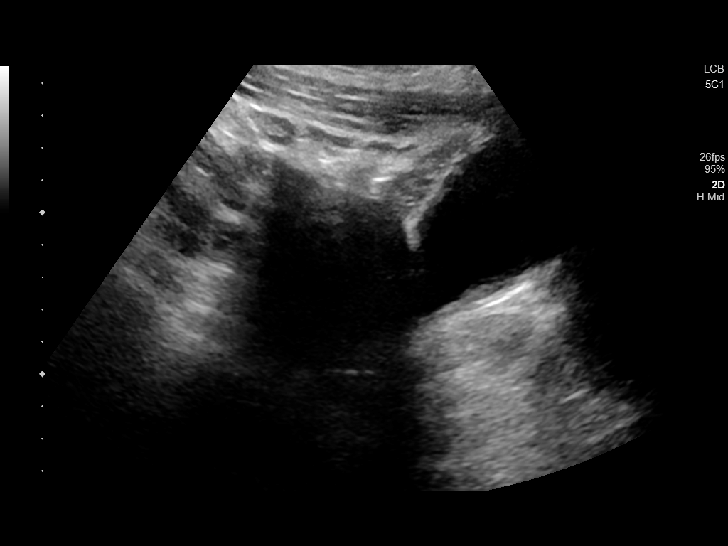
[im 14/83]
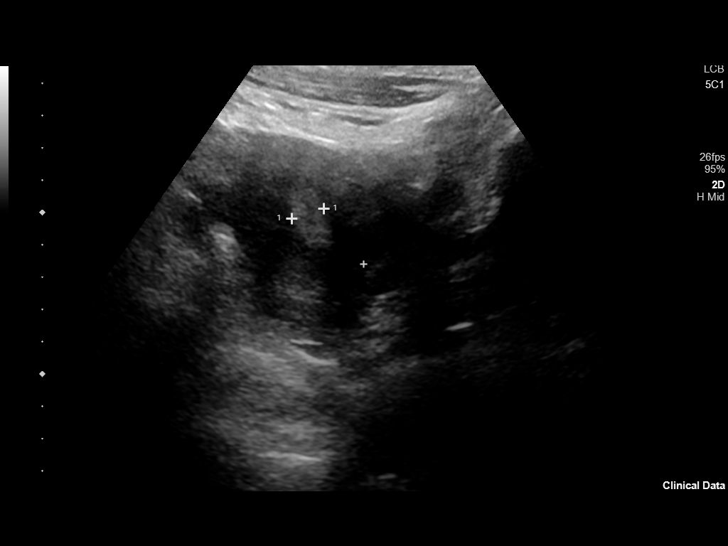
[im 21/83]
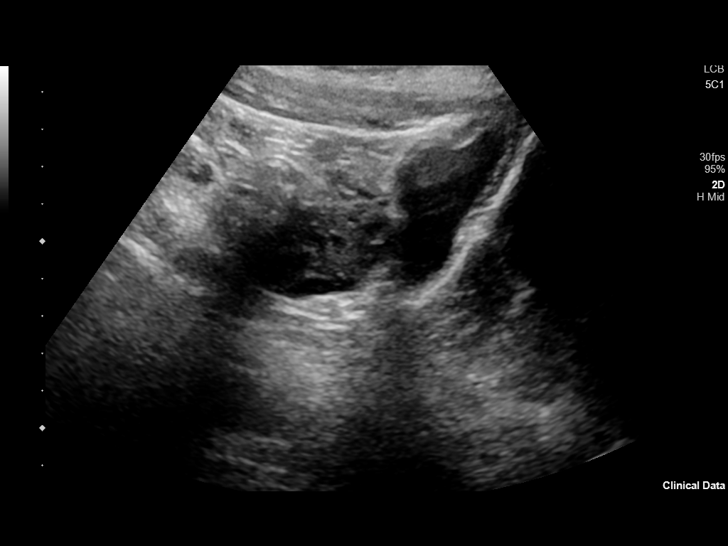
[im 28/83]
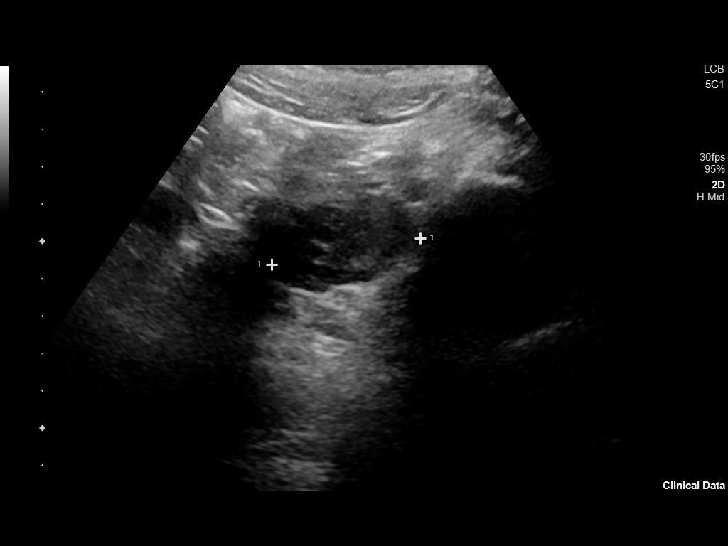
[im 31/83]
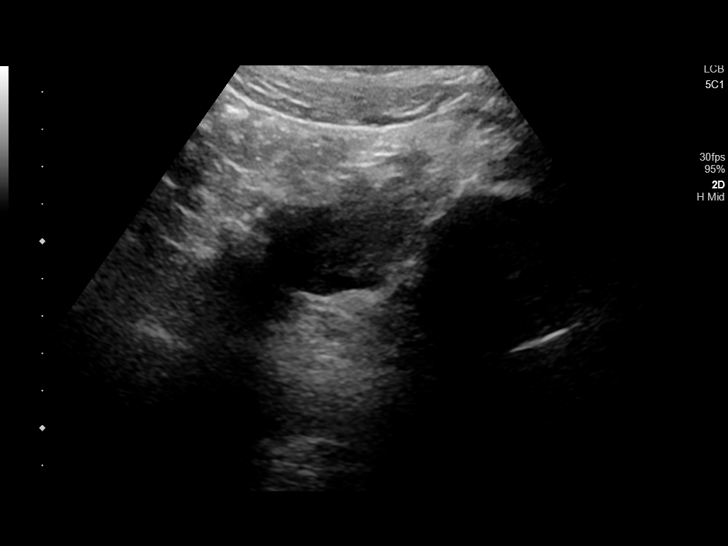
[im 38/83]
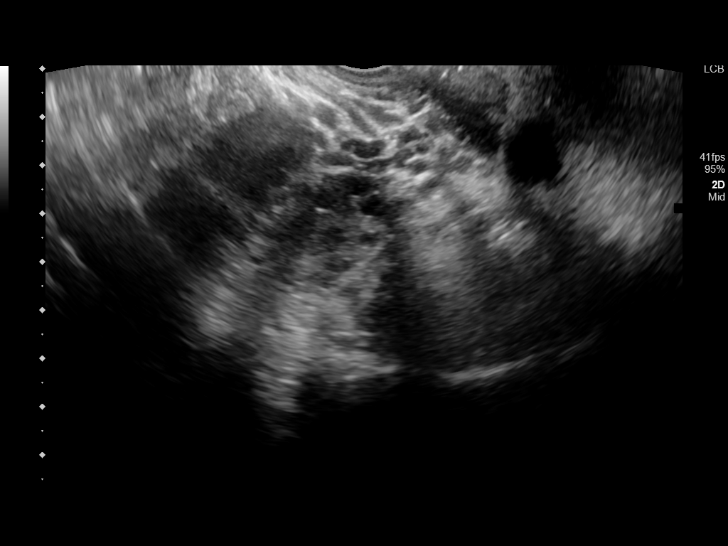
[im 45/83]
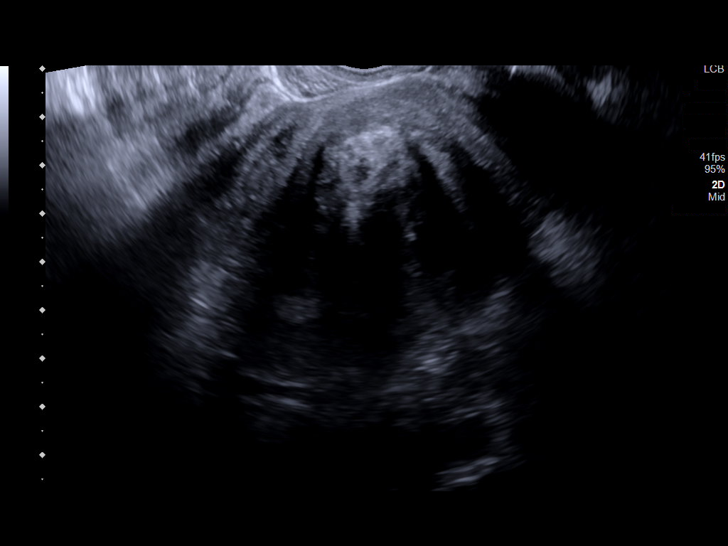
[im 52/83]
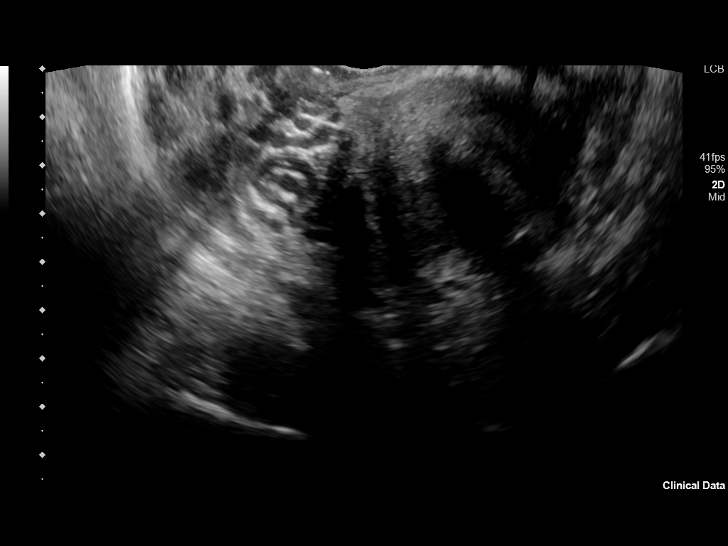
[im 55/83]
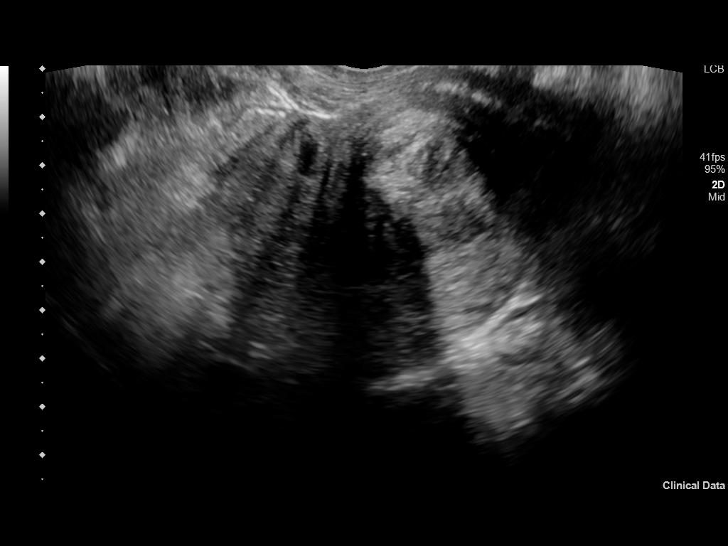
[im 62/83]
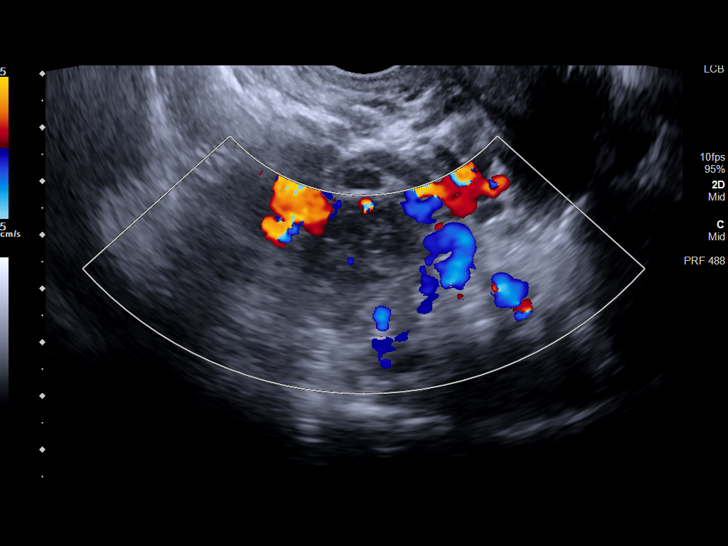
[im 69/83]
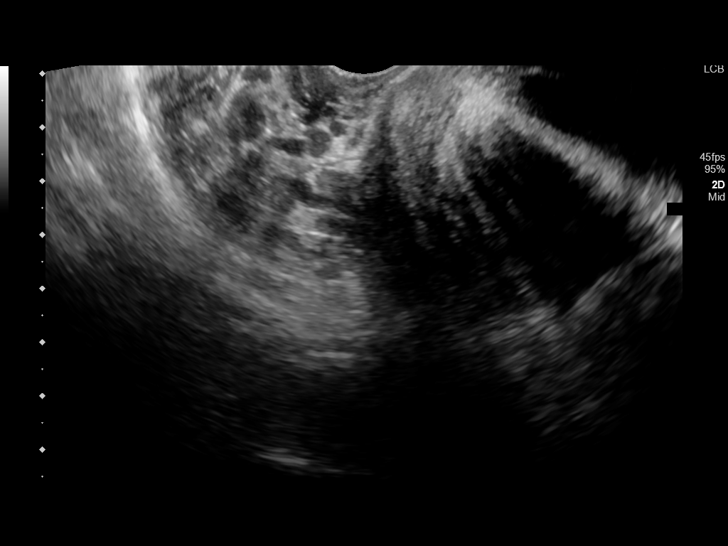
[im 76/83]
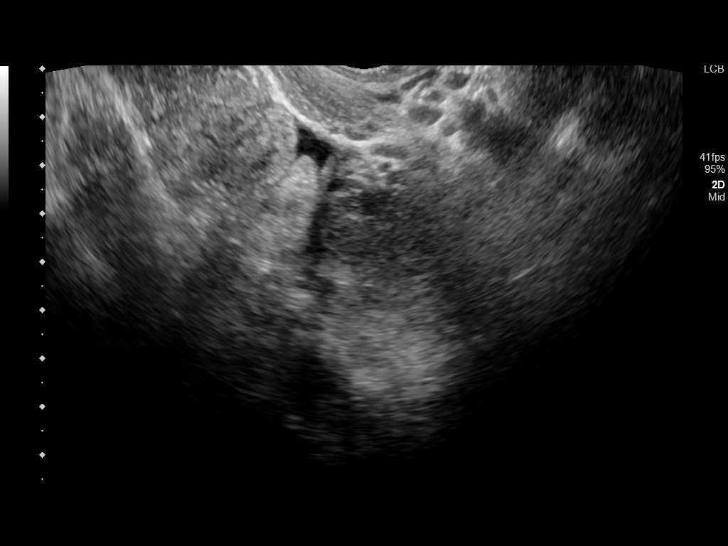
[im 83/83]
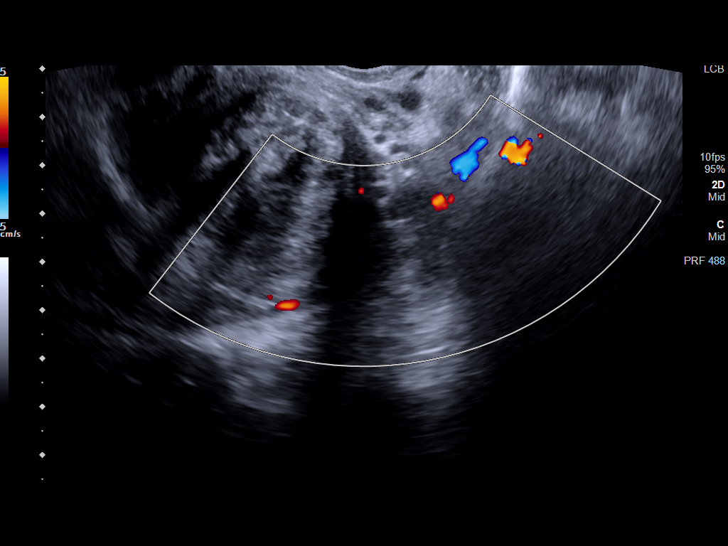

[14 of 25 positions shown; findings below may reference images not displayed]

FINDINGS: Uterus

Measurements: 8.7 x 5.7 x 8.1 cm = volume: 212 mL.

A 5.3 x 3.9 x 4.6 cm LEFT intramural fibroid is noted.

A 1.7 x 1.7 x 2.3 cm intramural fundal fibroid is noted.

Endometrium

Thickness: 6 mm.  No focal abnormality visualized.

Right ovary

Measurements: 2.6 x 2.3 x 1.9 cm = volume: 6 mL. Normal
appearance/no adnexal mass.

Left ovary

Measurements: 3.4 x 2.3 x 1.7 cm = volume: 7 mL. Normal
appearance/no adnexal mass.

Other findings

No abnormal free fluid.
IMPRESSION: 1. No acute abnormality.
2. 2 uterine fibroids as described.  The largest measuring 5.3 cm.
3. Unremarkable ovaries.

## 2021-05-13 ENCOUNTER — Other Ambulatory Visit: Payer: Self-pay

## 2023-07-31 ENCOUNTER — Ambulatory Visit (LOCAL_COMMUNITY_HEALTH_CENTER): Payer: Self-pay

## 2023-07-31 VITALS — BP 131/65 | Ht 69.0 in | Wt 218.0 lb

## 2023-07-31 DIAGNOSIS — Z3201 Encounter for pregnancy test, result positive: Secondary | ICD-10-CM

## 2023-07-31 DIAGNOSIS — Z309 Encounter for contraceptive management, unspecified: Secondary | ICD-10-CM

## 2023-07-31 LAB — PREGNANCY, URINE: Preg Test, Ur: POSITIVE — AB

## 2023-07-31 MED ORDER — PRENATAL VITAMINS 28-0.8 MG PO TABS: 28.0000 mg | ORAL_TABLET | Freq: Every day | ORAL | Status: AC

## 2023-07-31 NOTE — Progress Notes (Signed)
 UPT positive.  Positive pregnancy test package given and reviewed with pt.  See pregnancy test flowsheet.  States she is unsure of plans for continuing pregnancy; resources provided in packet.    The patient was dispensed prenatal vitamins today. I provided counseling today regarding the medication. Patient given the opportunity to ask questions. Questions answered.   Stressed need to establish prenatal care early if desires to continue with pregnancy.  Pt states she will followup with DSS re: her medicaid status.  Shona KATHEE Diesel, RN

## 2023-08-02 ENCOUNTER — Ambulatory Visit: Payer: Self-pay

## 2023-09-01 ENCOUNTER — Emergency Department
Admission: EM | Admit: 2023-09-01 | Discharge: 2023-09-01 | Disposition: A | Discharge status: Home / Self Care | Payer: Medicaid Other | Attending: Emergency Medicine | Admitting: Emergency Medicine

## 2023-09-01 DIAGNOSIS — O036 Delayed or excessive hemorrhage following complete or unspecified spontaneous abortion: Secondary | ICD-10-CM | POA: Diagnosis not present

## 2023-09-01 DIAGNOSIS — N939 Abnormal uterine and vaginal bleeding, unspecified: Secondary | ICD-10-CM | POA: Diagnosis present

## 2023-09-01 LAB — COMPREHENSIVE METABOLIC PANEL
ALT: 12 U/L (ref 0–44)
AST: 15 U/L (ref 15–41)
Albumin: 3.7 g/dL (ref 3.5–5.0)
Alkaline Phosphatase: 40 U/L (ref 38–126)
Anion gap: 11 (ref 5–15)
BUN: <5 mg/dL — ABNORMAL LOW (ref 6–20)
CO2: 20 mmol/L — ABNORMAL LOW (ref 22–32)
Calcium: 9.2 mg/dL (ref 8.9–10.3)
Chloride: 106 mmol/L (ref 98–111)
Creatinine, Ser: 0.68 mg/dL (ref 0.44–1.00)
GFR, Estimated: >60 mL/min (ref >60)
Glucose, Bld: 104 mg/dL — ABNORMAL HIGH (ref 70–99)
Potassium: 3.2 mmol/L — ABNORMAL LOW (ref 3.5–5.1)
Sodium: 137 mmol/L (ref 135–145)
Total Bilirubin: 0.5 mg/dL (ref 0.0–1.2)
Total Protein: 7.4 g/dL (ref 6.5–8.1)

## 2023-09-01 LAB — CBC
HCT: 38.1 % (ref 36.0–46.0)
Hemoglobin: 12.9 g/dL (ref 12.0–15.0)
MCH: 29.3 pg (ref 26.0–34.0)
MCHC: 33.9 g/dL (ref 30.0–36.0)
MCV: 86.4 fL (ref 80.0–100.0)
Platelets: 214 10*3/uL (ref 150–400)
RBC: 4.41 MIL/uL (ref 3.87–5.11)
RDW: 15 % (ref 11.5–15.5)
WBC: 12.3 10*3/uL — ABNORMAL HIGH (ref 4.0–10.5)
nRBC: 0 % (ref 0.0–0.2)

## 2023-09-01 MED ORDER — ONDANSETRON 4 MG PO TBDP
4.0000 mg | ORAL_TABLET | Freq: Once | ORAL | Status: AC | PRN
  Administered 2023-09-01: 4 mg via ORAL
  Filled 2023-09-01: qty 1

## 2023-09-01 MED ORDER — KETOROLAC TROMETHAMINE 30 MG/ML IJ SOLN
30.0000 mg | Freq: Once | INTRAMUSCULAR | Status: AC
  Administered 2023-09-01: 30 mg via INTRAMUSCULAR
  Filled 2023-09-01: qty 1

## 2023-09-01 NOTE — ED Triage Notes (Signed)
 Patient to ED from home with lower abdominal pain, vaginal bleeding, and nausea for three days after taking an abortion medication. Pt reports soaking pads with clots every 2 hours.

## 2023-09-01 NOTE — ED Provider Notes (Signed)
 [facility] Provider Note    Event Date/Time   First MD Initiated Contact with Patient 09/01/23 [phone]     (approximate)   History   Abdominal Pain and Vaginal Bleeding   HPI  Pamela Brock is a 23 y.o. female who reports that 3 days ago she took pills for medically induced abortion.  She has had cramping and vaginal bleeding over the last 24 hours and so wanted to make sure it was okay for her to take additional medications that was prescribed for her.  She reports overnight her bleeding has improved and her cramping has improved also.     Physical Exam   Triage Vital Signs: ED Triage Vitals  Encounter Vitals Group     BP 09/01/23 0617 118/86     Systolic BP Percentile --      Diastolic BP Percentile --      Pulse Rate 09/01/23 0617 65     Resp 09/01/23 0617 19     Temp 09/01/23 0617 (!) 97.5 F (36.4 C)     Temp Source 09/01/23 0617 Oral     SpO2 09/01/23 0617 98 %     Weight 09/01/23 0615 104.3 kg (230 lb)     Height 09/01/23 0615 1.753 m (5\' 9" )     Head Circumference --      Peak Flow --      Pain Score 09/01/23 0614 8     Pain Loc --      Pain Education --      Exclude from Growth Chart --     Most recent vital signs: Vitals:   09/01/23 0617  BP: 118/86  Pulse: 65  Resp: 19  Temp: (!) 97.5 F (36.4 C)  SpO2: 98%     General: Awake, no distress.  Well-appearing no acute distress CV:  Good peripheral perfusion.  Resp:  Normal effort.  Abd:  No distention.  Soft, nontender Other:     ED Results / Procedures / Treatments   Labs (all labs ordered are listed, but only abnormal results are displayed) Labs Reviewed  COMPREHENSIVE METABOLIC PANEL - Abnormal; Notable for the following components:      Result Value   Potassium 3.2 (*)    CO2 20 (*)    Glucose, Bld 104 (*)    BUN <5 (*)    All other components within normal limits  CBC - Abnormal; Notable for the following components:   WBC 12.3 (*)    All other  components within normal limits  PREGNANCY, URINE  URINALYSIS, ROUTINE W REFLEX MICROSCOPIC     EKG     RADIOLOGY     PROCEDURES:  Critical Care performed:   Procedures   MEDICATIONS ORDERED IN ED: Medications  ondansetron (ZOFRAN-ODT) disintegrating tablet 4 mg (4 mg Oral Given 09/01/23 0624)  ketorolac (TORADOL) 30 MG/ML injection 30 mg (30 mg Intramuscular Given 09/01/23 0758)     IMPRESSION / MDM / ASSESSMENT AND PLAN / ED COURSE  I reviewed the triage vital signs and the nursing notes. Patient's presentation is most consistent with acute illness / injury with system symptoms.  Patient is undergoing miscarriage, presented for pelvic cramping, vaginal bleeding, both of which have improved in the last couple of hours.  Lab work obtained which demonstrates reassuring hemoglobin.  Counseled patient that it is appropriate to take the remainder of her pills and that the symptoms are typical of miscarriage        FINAL  CLINICAL IMPRESSION(S) / ED DIAGNOSES   Final diagnoses:  Abortion with bleeding     Rx / DC Orders   ED Discharge Orders     None        Note:  This document was prepared using Dragon voice recognition software and may include unintentional dictation errors.   Jene Every, MD 09/01/23 [phone]

## 2023-10-09 ENCOUNTER — Ambulatory Visit

## 2023-10-11 ENCOUNTER — Encounter: Payer: Self-pay | Admitting: Nurse Practitioner

## 2023-10-11 ENCOUNTER — Ambulatory Visit: Admitting: Nurse Practitioner

## 2023-10-11 DIAGNOSIS — Z202 Contact with and (suspected) exposure to infections with a predominantly sexual mode of transmission: Secondary | ICD-10-CM

## 2023-10-11 DIAGNOSIS — Z113 Encounter for screening for infections with a predominantly sexual mode of transmission: Secondary | ICD-10-CM | POA: Diagnosis not present

## 2023-10-11 LAB — WET PREP FOR TRICH, YEAST, CLUE
Trichomonas Exam: NEGATIVE
Yeast Exam: NEGATIVE

## 2023-10-11 MED ORDER — METRONIDAZOLE 500 MG PO TABS: 500.0000 mg | ORAL_TABLET | Freq: Two times a day (BID) | ORAL | Status: AC

## 2023-10-11 NOTE — Progress Notes (Signed)
 Pt is here for STD visit.  Wet prep results reviewed with pt, no treatment required per standing order.  Condoms given. The patient was dispensed metronidazole#14 today. I provided counseling today regarding the medication. We discussed the medication, the side effects and when to call clinic. Patient given the opportunity to ask questions. Questions answered.   Gaspar Garbe, RN

## 2023-10-16 ENCOUNTER — Encounter: Payer: Self-pay | Admitting: Nurse Practitioner

## 2023-10-16 NOTE — Progress Notes (Signed)
 [facility] Department STI clinic [address], [address] [place] [address] Main phone: [phone]  STI screening visit  Subjective:  Pamela Brock is a 23 y.o. female being seen today for an STI screening visit. The patient reports they do not have symptoms.  Patient reports that they do not desire a pregnancy in the next year.   They reported they are not interested in discussing contraception today.    No LMP recorded (lmp unknown). (Menstrual status: Irregular Periods).  Patient has the following medical conditions:  Patient Active Problem List   Diagnosis Date Noted   Marijuana abuse 04/07/2019    Chief Complaint  Patient presents with   SEXUALLY TRANSMITTED DISEASE   Patient is a pleasant 23 y.o. female who presents to the office today requesting asymptomatic STI testing and reports she is a contact to [place]. Patient indicates 1 female partner in the last 2 months. She reports practicing vaginal and oral sex and uses condoms sometimes. Patient indicates a history of chlamydia. Patient reports last sex was 1 week ago. She indicates no current use of a contraception method.  Patient indicates LMP is unknown. Patient had positive UPT in this clinic 07/31/23 and then had medication induced miscarriage around 09/01/23.    Last HIV test per patient/review of record was  Lab Results  Component Value Date   HMHIVSCREEN Negative - Validated 04/16/2019    Lab Results  Component Value Date   HIV NON REACTIVE 09/12/2019     Last HEPC test per patient/review of record was  Lab Results  Component Value Date   HMHEPCSCREEN Negative-Validated 04/16/2019   No components found for: "HEPC"   Last HEPB test per patient/review of record was No components found for: "HMHEPBSCREEN"   Patient reports last pap was:   No results found for: "DIAGPAP", "HPVHIGH", "ADEQPAP" No results found for: "SPECADGYN" No Cervical Cancer Screening results to  display.  Screening for MPX risk: Does the patient have an unexplained rash? No Is the patient MSM? No Does the patient endorse multiple sex partners or anonymous sex partners? No Did the patient have close or sexual contact with a person diagnosed with MPX? No Has the patient traveled outside the [place] where MPX is endemic? No Is there a high clinical suspicion for MPX-- evidenced by one of the following No  -Unlikely to be chickenpox  -Lymphadenopathy  -Rash that present in same phase of evolution on any given body part See flowsheet for further details and programmatic requirements.   Immunization history:   There is no immunization history on file for this patient.   The following portions of the patient's history were reviewed and updated as appropriate: allergies, current medications, past medical history, past social history, past surgical history and problem list.  Objective:  There were no vitals filed for this visit.  Physical Exam Nursing note reviewed.  Constitutional:      Appearance: Normal appearance.  HENT:     Head: Normocephalic.     Salivary Glands: Right salivary gland is not diffusely enlarged or tender. Left salivary gland is not diffusely enlarged or tender.     Mouth/Throat:     Lips: Pink. No lesions.     Mouth: Mucous membranes are moist.     Tongue: No lesions. Tongue does not deviate from midline.     Pharynx: Oropharynx is clear. Uvula midline. No oropharyngeal exudate or posterior oropharyngeal erythema.     Tonsils: No tonsillar exudate.  Eyes:  General:        Right eye: No discharge.        Left eye: No discharge.  Pulmonary:     Effort: Pulmonary effort is normal.  Genitourinary:    Comments: Patient asymptomatic. Declines genital exam. Self-swabbing.  Lymphadenopathy:     Head:     Right side of head: No submental, submandibular, tonsillar, preauricular or posterior auricular adenopathy.     Left side of head: No submental,  submandibular, tonsillar, preauricular or posterior auricular adenopathy.     Cervical: No cervical adenopathy.     Right cervical: No superficial or posterior cervical adenopathy.    Left cervical: No superficial or posterior cervical adenopathy.     Upper Body:     Right upper body: No supraclavicular or axillary adenopathy.     Left upper body: No supraclavicular or axillary adenopathy.  Skin:    General: Skin is warm and dry.     Comments: Skin tone appropriate for ethnicity. Assessed exposed areas only and back.   Neurological:     Mental Status: She is alert and oriented to person, place, and time.  Psychiatric:        Attention and Perception: Attention and perception normal.        Mood and Affect: Mood and affect normal.        Speech: Speech normal.        Behavior: Behavior normal. Behavior is cooperative.        Thought Content: Thought content normal.     Assessment and Plan:  Pamela Brock is a 23 y.o. female presenting to the [facility] Department for STI screening  1. Screening for venereal disease (Primary)  - Chlamydia/Gonorrhea [facility] - Gonococcus culture - WET PREP FOR TRICH, YEAST, CLUE  2. Trichomonas contact Treating per SO as a contact to trich.  - metroNIDAZOLE (FLAGYL) 500 MG tablet; Take 1 tablet (500 mg total) by mouth 2 (two) times daily for 7 days.  Patient accepted some screenings including oral, vaginal CT/GC and bloodwork for HIV/RPR, and wet prep. Patient meets criteria for HepB screening? No. Ordered? Patient declined Patient meets criteria for HepC screening? No. Ordered? Patient declined  Treat wet prep per standing order Discussed time line for [facility] results and that patient will be called with positive results and encouraged patient to call if she had not heard in 2 weeks.  Counseled to return or seek care for continued or worsening symptoms Recommended repeat testing in 3 months with positive  results. Recommended condom use with all sex for STI prevention.   Patient is currently using  noting  to prevent pregnancy.    Return if symptoms worsen or fail to improve.  No future appointments.  Total time with patient 20 minutes.   Edmonia James, NP

## 2023-10-17 LAB — GONOCOCCUS CULTURE

## 2023-12-13 ENCOUNTER — Other Ambulatory Visit: Payer: Self-pay

## 2023-12-13 ENCOUNTER — Encounter: Payer: Self-pay | Admitting: *Deleted

## 2023-12-13 DIAGNOSIS — M545 Low back pain, unspecified: Secondary | ICD-10-CM | POA: Diagnosis present

## 2023-12-13 DIAGNOSIS — R519 Headache, unspecified: Secondary | ICD-10-CM | POA: Diagnosis not present

## 2023-12-13 DIAGNOSIS — Y92410 Unspecified street and highway as the place of occurrence of the external cause: Secondary | ICD-10-CM | POA: Diagnosis not present

## 2023-12-13 DIAGNOSIS — R1030 Lower abdominal pain, unspecified: Secondary | ICD-10-CM | POA: Insufficient documentation
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
## 2023-12-14 ENCOUNTER — Emergency Department
Admission: EM | Admit: 2023-12-14 | Discharge: 2023-12-14 | Disposition: A | Discharge status: Home / Self Care | Attending: Emergency Medicine | Admitting: Emergency Medicine

## 2023-12-14 ENCOUNTER — Emergency Department

## 2023-12-14 LAB — URINALYSIS, ROUTINE W REFLEX MICROSCOPIC
Bacteria, UA: NONE SEEN
Bilirubin Urine: NEGATIVE
Glucose, UA: NEGATIVE mg/dL
Ketones, ur: NEGATIVE mg/dL
Leukocytes,Ua: NEGATIVE
Nitrite: NEGATIVE
Protein, ur: 100 mg/dL — AB
RBC / HPF: >50 RBC/hpf (ref 0–5)
Specific Gravity, Urine: 1.023 (ref 1.005–1.030)
pH: 5 (ref 5.0–8.0)

## 2023-12-14 LAB — POC URINE PREG, ED: Preg Test, Ur: NEGATIVE

## 2023-12-14 MED ORDER — METHOCARBAMOL 500 MG PO TABS: 500.0000 mg | ORAL_TABLET | Freq: Three times a day (TID) | ORAL | 0 refills | Status: AC | PRN

## 2023-12-14 MED ORDER — IBUPROFEN 800 MG PO TABS
800.0000 mg | ORAL_TABLET | Freq: Once | ORAL | Status: AC
  Administered 2023-12-14: 800 mg via ORAL
  Filled 2023-12-14: qty 1

## 2023-12-14 MED ORDER — IBUPROFEN 800 MG PO TABS: 800.0000 mg | ORAL_TABLET | Freq: Three times a day (TID) | ORAL | 0 refills | Status: AC | PRN
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
## 2023-12-14 NOTE — Discharge Instructions (Addendum)
You may alternate Tylenol 1000 mg every 6 hours as needed for pain, fever and Ibuprofen 800 mg every 6-8 hours as needed for pain, fever.  Please take Ibuprofen with food.  Do not take more than 4000 mg of Tylenol (acetaminophen) in a 24 hour period. ° °

## 2024-04-15 ENCOUNTER — Encounter: Admitting: Obstetrics

## 2024-08-11 ENCOUNTER — Encounter: Admitting: Obstetrics

## 2024-09-24 ENCOUNTER — Encounter: Admitting: Obstetrics
